# Patient Record
Sex: Female | Born: 1990 | Race: White | Hispanic: No | Marital: Married | Smoking: Never smoker
Health system: Southern US, Community
[De-identification: ages and names within clinical notes are randomized; demographics above are authoritative.]

## PROBLEM LIST (undated history)

## (undated) DIAGNOSIS — Z789 Other specified health status: Secondary | ICD-10-CM

## (undated) HISTORY — PX: NO PAST SURGERIES: SHX2092

---

## 2018-07-30 ENCOUNTER — Ambulatory Visit (HOSPITAL_COMMUNITY)
Admission: AD | Admit: 2018-07-30 | Discharge: 2018-07-30 | Disposition: A | Discharge status: Home / Self Care | Payer: Self-pay | Source: Ambulatory Visit | Attending: General Surgery | Admitting: General Surgery

## 2018-07-30 ENCOUNTER — Encounter (HOSPITAL_COMMUNITY)
Admission: AD | Disposition: A | Discharge status: Home / Self Care | Payer: Self-pay | Source: Ambulatory Visit | Attending: General Surgery

## 2018-07-30 ENCOUNTER — Emergency Department (HOSPITAL_COMMUNITY): Payer: Self-pay

## 2018-07-30 ENCOUNTER — Emergency Department (HOSPITAL_COMMUNITY)
Admission: EM | Admit: 2018-07-30 | Discharge: 2018-07-30 | Disposition: A | Discharge status: Home / Self Care | Payer: Self-pay | Attending: Emergency Medicine | Admitting: Emergency Medicine

## 2018-07-30 ENCOUNTER — Ambulatory Visit (HOSPITAL_COMMUNITY): Payer: Self-pay | Admitting: Certified Registered Nurse Anesthetist

## 2018-07-30 ENCOUNTER — Encounter (HOSPITAL_COMMUNITY): Payer: Self-pay | Admitting: Certified Registered Nurse Anesthetist

## 2018-07-30 ENCOUNTER — Encounter (HOSPITAL_COMMUNITY): Payer: Self-pay

## 2018-07-30 DIAGNOSIS — S52502A Unspecified fracture of the lower end of left radius, initial encounter for closed fracture: Secondary | ICD-10-CM | POA: Insufficient documentation

## 2018-07-30 DIAGNOSIS — S52592A Other fractures of lower end of left radius, initial encounter for closed fracture: Secondary | ICD-10-CM | POA: Insufficient documentation

## 2018-07-30 DIAGNOSIS — W1830XA Fall on same level, unspecified, initial encounter: Secondary | ICD-10-CM | POA: Insufficient documentation

## 2018-07-30 DIAGNOSIS — Y929 Unspecified place or not applicable: Secondary | ICD-10-CM | POA: Insufficient documentation

## 2018-07-30 DIAGNOSIS — Y9351 Activity, roller skating (inline) and skateboarding: Secondary | ICD-10-CM | POA: Insufficient documentation

## 2018-07-30 DIAGNOSIS — Y999 Unspecified external cause status: Secondary | ICD-10-CM | POA: Insufficient documentation

## 2018-07-30 HISTORY — DX: Other specified health status: Z78.9

## 2018-07-30 HISTORY — PX: ORIF WRIST FRACTURE: SHX2133

## 2018-07-30 LAB — POCT PREGNANCY, URINE: Preg Test, Ur: NEGATIVE

## 2018-07-30 SURGERY — OPEN REDUCTION INTERNAL FIXATION (ORIF) WRIST FRACTURE
Anesthesia: Monitor Anesthesia Care | Site: Wrist | Laterality: Left

## 2018-07-30 MED ORDER — FENTANYL CITRATE (PF) 100 MCG/2ML IJ SOLN: 25.0000 ug | INTRAMUSCULAR | Status: DC | PRN

## 2018-07-30 MED ORDER — DEXAMETHASONE SODIUM PHOSPHATE 10 MG/ML IJ SOLN
INTRAMUSCULAR | Status: DC | PRN
  Administered 2018-07-30: 10 mg via INTRAVENOUS

## 2018-07-30 MED ORDER — HYDROMORPHONE HCL 1 MG/ML IJ SOLN
1.0000 mg | Freq: Once | INTRAMUSCULAR | Status: AC
  Administered 2018-07-30: 1 mg via INTRAMUSCULAR
  Filled 2018-07-30: qty 1

## 2018-07-30 MED ORDER — ONDANSETRON HCL 4 MG/2ML IJ SOLN
INTRAMUSCULAR | Status: AC
  Filled 2018-07-30: qty 2

## 2018-07-30 MED ORDER — FENTANYL CITRATE (PF) 250 MCG/5ML IJ SOLN
INTRAMUSCULAR | Status: AC
  Filled 2018-07-30: qty 5

## 2018-07-30 MED ORDER — CEFAZOLIN SODIUM-DEXTROSE 2-4 GM/100ML-% IV SOLN
2.0000 g | Freq: Once | INTRAVENOUS | Status: AC
  Administered 2018-07-30: 2 g via INTRAVENOUS

## 2018-07-30 MED ORDER — LIDOCAINE 2% (20 MG/ML) 5 ML SYRINGE
INTRAMUSCULAR | Status: AC
  Filled 2018-07-30: qty 5

## 2018-07-30 MED ORDER — PROPOFOL 10 MG/ML IV BOLUS
INTRAVENOUS | Status: DC | PRN
  Administered 2018-07-30: 40 mg via INTRAVENOUS
  Administered 2018-07-30: 30 mg via INTRAVENOUS
  Administered 2018-07-30: 20 mg via INTRAVENOUS
  Administered 2018-07-30: 40 mg via INTRAVENOUS
  Administered 2018-07-30: 30 mg via INTRAVENOUS
  Administered 2018-07-30: 40 mg via INTRAVENOUS

## 2018-07-30 MED ORDER — FENTANYL CITRATE (PF) 100 MCG/2ML IJ SOLN: 50.0000 ug | Freq: Once | INTRAMUSCULAR | Status: DC

## 2018-07-30 MED ORDER — PROPOFOL 500 MG/50ML IV EMUL
INTRAVENOUS | Status: DC | PRN
  Administered 2018-07-30: 100 ug/kg/min via INTRAVENOUS

## 2018-07-30 MED ORDER — OXYCODONE-ACETAMINOPHEN 5-325 MG PO TABS
1.0000 | ORAL_TABLET | ORAL | Status: DC | PRN
  Administered 2018-07-30: 1 via ORAL
  Filled 2018-07-30: qty 1

## 2018-07-30 MED ORDER — LACTATED RINGERS IV SOLN
INTRAVENOUS | Status: DC | PRN
  Administered 2018-07-30 (×2): via INTRAVENOUS

## 2018-07-30 MED ORDER — ONDANSETRON HCL 4 MG/2ML IJ SOLN
INTRAMUSCULAR | Status: DC | PRN
  Administered 2018-07-30: 4 mg via INTRAVENOUS

## 2018-07-30 MED ORDER — BUPIVACAINE HCL (PF) 0.25 % IJ SOLN
INTRAMUSCULAR | Status: AC
  Filled 2018-07-30: qty 30

## 2018-07-30 MED ORDER — OXYCODONE-ACETAMINOPHEN 5-325 MG PO TABS: 1.0000 | ORAL_TABLET | Freq: Four times a day (QID) | ORAL | 0 refills | Status: DC | PRN

## 2018-07-30 MED ORDER — MIDAZOLAM HCL 2 MG/2ML IJ SOLN
INTRAMUSCULAR | Status: AC
  Filled 2018-07-30: qty 2

## 2018-07-30 MED ORDER — 0.9 % SODIUM CHLORIDE (POUR BTL) OPTIME
TOPICAL | Status: DC | PRN
  Administered 2018-07-30: 1000 mL

## 2018-07-30 MED ORDER — BUPIVACAINE-EPINEPHRINE (PF) 0.5% -1:200000 IJ SOLN
INTRAMUSCULAR | Status: DC | PRN
  Administered 2018-07-30: 30 mL via PERINEURAL

## 2018-07-30 MED ORDER — FENTANYL CITRATE (PF) 100 MCG/2ML IJ SOLN
INTRAMUSCULAR | Status: AC
  Administered 2018-07-30: 50 ug via INTRAVENOUS
  Filled 2018-07-30: qty 2

## 2018-07-30 MED ORDER — MIDAZOLAM HCL 2 MG/2ML IJ SOLN
2.0000 mg | Freq: Once | INTRAMUSCULAR | Status: AC
  Administered 2018-07-30: 2 mg via INTRAVENOUS

## 2018-07-30 MED ORDER — CEFAZOLIN SODIUM-DEXTROSE 2-4 GM/100ML-% IV SOLN
INTRAVENOUS | Status: AC
  Filled 2018-07-30: qty 100

## 2018-07-30 MED ORDER — DEXAMETHASONE SODIUM PHOSPHATE 10 MG/ML IJ SOLN
INTRAMUSCULAR | Status: AC
  Filled 2018-07-30: qty 1

## 2018-07-30 MED ORDER — DEXMEDETOMIDINE HCL IN NACL 200 MCG/50ML IV SOLN
INTRAVENOUS | Status: DC | PRN
  Administered 2018-07-30: 12 ug via INTRAVENOUS
  Administered 2018-07-30 (×2): 8 ug via INTRAVENOUS

## 2018-07-30 MED ORDER — PHENYLEPHRINE 40 MCG/ML (10ML) SYRINGE FOR IV PUSH (FOR BLOOD PRESSURE SUPPORT)
PREFILLED_SYRINGE | INTRAVENOUS | Status: DC | PRN
  Administered 2018-07-30: 80 ug via INTRAVENOUS

## 2018-07-30 MED ORDER — PHENYLEPHRINE 40 MCG/ML (10ML) SYRINGE FOR IV PUSH (FOR BLOOD PRESSURE SUPPORT)
PREFILLED_SYRINGE | INTRAVENOUS | Status: AC
  Filled 2018-07-30: qty 10

## 2018-07-30 MED ORDER — LIDOCAINE-EPINEPHRINE (PF) 1.5 %-1:200000 IJ SOLN
INTRAMUSCULAR | Status: DC | PRN
  Administered 2018-07-30: 10 mL via PERINEURAL

## 2018-07-30 SURGICAL SUPPLY — 60 items
BANDAGE ACE 3X5.8 VEL STRL LF (GAUZE/BANDAGES/DRESSINGS) ×3 IMPLANT
BIT DRILL 2 FAST STEP (BIT) ×6 IMPLANT
BIT DRILL 2.5X4 QC (BIT) ×3 IMPLANT
BNDG ESMARK 4X9 LF (GAUZE/BANDAGES/DRESSINGS) IMPLANT
CANISTER SUCTION 1500CC (MISCELLANEOUS) ×3 IMPLANT
CHLORAPREP W/TINT 26ML (MISCELLANEOUS) ×3 IMPLANT
CORD BIPOLAR FORCEPS 12FT (ELECTRODE) ×3 IMPLANT
CORDS BIPOLAR (ELECTRODE) IMPLANT
COVER SURGICAL LIGHT HANDLE (MISCELLANEOUS) ×6 IMPLANT
CUFF TOURNIQUET SINGLE 18IN (TOURNIQUET CUFF) ×3 IMPLANT
CUFF TOURNIQUET SINGLE 24IN (TOURNIQUET CUFF) IMPLANT
DRAIN TLS ROUND 10FR (DRAIN) IMPLANT
DRAPE OEC MINIVIEW 54X84 (DRAPES) ×3 IMPLANT
DRAPE SURG 17X23 STRL (DRAPES) ×3 IMPLANT
DRIVER PEG 2.0 FAST (BIT) ×3 IMPLANT
GAUZE SPONGE 4X4 12PLY STRL LF (GAUZE/BANDAGES/DRESSINGS) ×3 IMPLANT
GAUZE XEROFORM 1X8 LF (GAUZE/BANDAGES/DRESSINGS) ×3 IMPLANT
GLOVE BIOGEL M 8.0 STRL (GLOVE) ×3 IMPLANT
GLOVE BIOGEL PI IND STRL 7.0 (GLOVE) ×1 IMPLANT
GLOVE BIOGEL PI INDICATOR 7.0 (GLOVE) ×2
GLOVE SURG SS PI 7.5 STRL IVOR (GLOVE) ×12 IMPLANT
GOWN STRL REUS W/ TWL XL LVL3 (GOWN DISPOSABLE) ×1 IMPLANT
GOWN STRL REUS W/TWL XL LVL3 (GOWN DISPOSABLE) ×2
K-WIRE 1.6 (WIRE) ×4
K-WIRE FX5X1.6XNS BN SS (WIRE) ×2
KIT BASIN OR (CUSTOM PROCEDURE TRAY) ×3 IMPLANT
KWIRE FX5X1.6XNS BN SS (WIRE) ×2 IMPLANT
NEEDLE HYPO 22GX1.5 SAFETY (NEEDLE) ×3 IMPLANT
NS IRRIG 1000ML POUR BTL (IV SOLUTION) ×3 IMPLANT
PACK ORTHO EXTREMITY (CUSTOM PROCEDURE TRAY) ×3 IMPLANT
PAD CAST 3X4 CTTN HI CHSV (CAST SUPPLIES) IMPLANT
PAD CAST 4YDX4 CTTN HI CHSV (CAST SUPPLIES) IMPLANT
PADDING CAST ABS 4INX4YD NS (CAST SUPPLIES)
PADDING CAST ABS COTTON 4X4 ST (CAST SUPPLIES) IMPLANT
PADDING CAST COTTON 3X4 STRL (CAST SUPPLIES)
PADDING CAST COTTON 4X4 STRL (CAST SUPPLIES)
PEG THREADED 2.5MMX16MM LONG (Peg) ×6 IMPLANT
PEG THREADED 2.5MMX18MM LONG (Peg) ×12 IMPLANT
PLATE SHORT 21.6X48.9 NRRW LT (Plate) ×3 IMPLANT
SCREW BN 12X3.5XNS CORT TI (Screw) ×1 IMPLANT
SCREW CORT 3.5X12 (Screw) ×2 IMPLANT
SCREW CORT 3.5X14 LNG (Screw) ×6 IMPLANT
SPLINT FIBERGLASS 3X12 (CAST SUPPLIES) ×3 IMPLANT
SPLINT PLASTER CAST XFAST 4X15 (CAST SUPPLIES) IMPLANT
SPLINT PLASTER XTRA FAST SET 4 (CAST SUPPLIES)
SUT ETHILON 3 0 PS 1 (SUTURE) IMPLANT
SUT ETHILON 4 0 PS 2 18 (SUTURE) IMPLANT
SUT VIC AB 3-0 PS1 18 (SUTURE)
SUT VIC AB 3-0 PS1 18XBRD (SUTURE) IMPLANT
SUT VIC AB 3-0 SH 27 (SUTURE) ×2
SUT VIC AB 3-0 SH 27X BRD (SUTURE) ×1 IMPLANT
SUT VIC AB 4-0 PS2 18 (SUTURE) ×3 IMPLANT
SUT VICRYL 4-0 PS2 18IN ABS (SUTURE) IMPLANT
SYR BULB 3OZ (MISCELLANEOUS) ×3 IMPLANT
SYR CONTROL 10ML LL (SYRINGE) ×3 IMPLANT
TOWEL OR 17X24 6PK STRL BLUE (TOWEL DISPOSABLE) ×3 IMPLANT
TUBE CONNECTING 20'X1/4 (TUBING) ×1
TUBE CONNECTING 20X1/4 (TUBING) ×2 IMPLANT
UNDERPAD 30X30 (UNDERPADS AND DIAPERS) ×3 IMPLANT
WATER STERILE IRR 1000ML POUR (IV SOLUTION) ×3 IMPLANT

## 2018-07-30 NOTE — ED Provider Notes (Signed)
[facility] Provider Note   CSN: [id number] Arrival date & time: 07/30/18  0137     History   Chief Complaint Chief Complaint  Patient presents with  . Fall  . Wrist Pain    HPI Pamela Spence is a 27 y.o. female.  27 year old female presents to the emergency department for evaluation of left wrist pain after a fall while long boarding.  Patient fell on outstretched left hand.  Has had constant pain to her left wrist since this time.  Pain worse with movement.  Given percocet on arrival with only mild improvement to pain.  No numbness or paresthesias.  Patient is RHD.  The history is provided by the patient. No language interpreter was used.  Fall   Wrist Pain     History reviewed. No pertinent past medical history.  There are no active problems to display for this patient.   History reviewed. No pertinent surgical history.   OB History   None      Home Medications    Prior to Admission medications   Medication Sig Start Date End Date Taking? Authorizing Provider  oxyCODONE-acetaminophen (PERCOCET/ROXICET) 5-325 MG tablet Take 1-2 tablets by mouth every 6 (six) hours as needed for moderate pain or severe pain. 07/30/18   Antony MaduraHumes, Sonnia Strong, PA-C    Family History History reviewed. No pertinent family history.  Social History Social History   Tobacco Use  . Smoking status: Never Smoker  . Smokeless tobacco: Never Used  Substance Use Topics  . Alcohol use: Not on file  . Drug use: Not on file     Allergies   Patient has no known allergies.   Review of Systems Review of Systems Ten systems reviewed and are negative for acute change, except as noted in the HPI.    Physical Exam Updated Vital Signs BP 130/78 (BP Location: Right Arm)   Pulse 68   Temp 98.4 F (36.9 C)   Resp 16   Ht 5\' 4"  (1.626 m)   Wt 63.5 kg   LMP  (LMP Unknown)   SpO2 100%   BMI 24.03 kg/m   Physical Exam  Constitutional: She is  oriented to person, place, and time. She appears well-developed and well-nourished. No distress.  Nontoxic appearing and in NAD  HENT:  Head: Normocephalic and atraumatic.  Eyes: Conjunctivae and EOM are normal. No scleral icterus.  Neck: Normal range of motion.  Cardiovascular: Normal rate, regular rhythm and intact distal pulses.  Estil radial pulse 2+ in the left upper extremity.  Capillary refill brisk in all digits of the left hand.  Pulmonary/Chest: Effort normal. No stridor. No respiratory distress.  Respirations even and unlabored  Musculoskeletal:       Left wrist: She exhibits decreased range of motion, bony tenderness, swelling and deformity.  No tenting of skin to the L wrist.  Neurological: She is alert and oriented to person, place, and time. She exhibits normal muscle tone. Coordination normal.  Sensation to light touch intact in all digits of the left hand.  Able to wiggle all fingers.  Skin: Skin is warm and dry. No rash noted. She is not diaphoretic. No erythema. No pallor.  Psychiatric: She has a normal mood and affect. Her behavior is normal.  Nursing note and vitals reviewed.    ED Treatments / Results  Labs (all labs ordered are listed, but only abnormal results are displayed) Labs Reviewed - No data to display  EKG None  Radiology  Dg Wrist Complete Left  Result Date: 07/30/2018 CLINICAL DATA:  27 year old female with fall. EXAM: LEFT WRIST - COMPLETE 3+ VIEW COMPARISON:  None. FINDINGS: There is a comminuted and displaced fracture of the distal radius. There is dorsal angulation and displacement of the distal fracture fragment. No dislocation. There is mild juxta-articular osteopenia. There is soft tissue swelling of the wrist. IMPRESSION: Dorsally displaced and angulated comminuted fracture of the distal radius. Electronically Signed   By: Elgie Collard M.D.   On: 07/30/2018 02:14    Procedures Procedures (including critical care time)  Medications  Ordered in ED Medications  oxyCODONE-acetaminophen (PERCOCET/ROXICET) 5-325 MG per tablet 1 tablet (1 tablet Oral Given 07/30/18 0154)  HYDROmorphone (DILAUDID) injection 1 mg (1 mg Intramuscular Given 07/30/18 0351)    3:33 AM Case discussed with Dr. Izora Ribas on-call for hand surgery.  Is comfortable with splinting in the emergency department, but will see the patient in the office later today/tomorrow.  Requests that the patient not eat breakfast.  She will be instructed to call at 8:30 AM for formal appointment time.    Initial Impression / Assessment and Plan / ED Course  I have reviewed the triage vital signs and the nursing notes.  Pertinent labs & imaging results that were available during my care of the patient were reviewed by me and considered in my medical decision making (see chart for details).     Patient presents to the emergency department for evaluation of L wrist pain. Patient neurovascularly intact on exam. Imaging postive for comminuted, displaced fx of the distal radius of the L wrist. Compartments in the affected extremity are soft. She was placed in a sugar tong splint. Plan for close outpatient follow up with hand surgery. Rx for percocet given for pain control. Return precautions discussed and provided. Patient discharged in stable condition with no unaddressed concerns.   Final Clinical Impressions(s) / ED Diagnoses   Final diagnoses:  Closed fracture of distal end of left radius, unspecified fracture morphology, initial encounter    ED Discharge Orders         Ordered    oxyCODONE-acetaminophen (PERCOCET/ROXICET) 5-325 MG tablet  Every 6 hours PRN     07/30/18 0419           Antony Madura, PA-C 07/30/18 1610    Wilkie Aye Mayer Masker, MD 07/31/18 [phone]

## 2018-07-30 NOTE — Op Note (Signed)
NAMRuta Hinds: Spence, Pamela Spence MEDICAL RECORD [id number] ACCOUNT [id number] DATE OF BIRTH:Mar 24, 1991 FACILITY: [facility] LOCATION: [facility] PHYSICIAN:Chaundra Abreu C. Jania Steinke, MD  OPERATIVE REPORT  DATE OF PROCEDURE:  07/30/2018  PREOPERATIVE DIAGNOSIS:  Closed displaced fracture of the left distal radius.  POSTOPERATIVE DIAGNOSIS:  Closed displaced fracture of the left distal radius.  PROCEDURE:  Open reduction internal fixation of the left distal radius with a DVR anatomic volar plate.  ANESTHESIA:  Axillary block with IV sedation.    COMPLICATIONS:  None acute.  INDICATIONS:  The patient is a 27 year old female who fell on an outstretched hand earlier this morning sustaining a closed displaced fracture to her left wrist.  The fracture was unstable and such that operative fixation was felt necessary.  Risks,  benefits and alternatives of surgery were discussed with her.  She agreed to this course of action.  Consent was obtained.  DESCRIPTION OF PROCEDURE:  The patient was taken to the operating room after an axillary block was performed by anesthesia.  She was placed supine on the operating room table.  Timeout was performed.  Preoperative antibiotics were given.  The left upper  extremity was prepped and draped in normal sterile fashion.  A tourniquet was used on the upper arm.  It was exsanguinated and the tourniquet was inflated to 250 mmHg.  An incision overlying the volar wrist overlying the FCR tendon was then made.   Dissection between the FCR tendon and the radial artery was then performed down to the pronator quadratus muscle.  The pronator quadratus muscle was taken down in a hockey stick type fashion exposing the fracture site.  The fracture site was debrided of  soft tissue then manually reduced.  An appropriate sized DVR volar plate was chosen and held in place temporarily with K wires while x-rays revealed a good plate placement.  Radial shaft screw was drilled, measured and appropriate size  screw placed.  The  distal screws were each drilled, measured and appropriate sized locking screws were placed.  Next, the remaining radial shaft screws were drilled, measured and placed.  Final x-ray revealed near anatomic reduction of the fracture with good plate and  screw placement in length.  The wound was irrigated.  The deep soft tissues were closed with interrupted 3-0 Vicryl.  The skin was closed with a running 4-0 Vicryl in subcuticular fashion.  A sterile dressing and splint were applied.  The patient  tolerated the procedure well and was taken to recovery room stable.  TN/NUANCE  D:07/30/2018 T:07/30/2018 JOB:[id number]

## 2018-07-30 NOTE — Discharge Instructions (Addendum)
You were found to have a fracture to your distal radius.  You require follow-up with a hand surgeon.  Call the office of Dr. Izora Ribasoley at 8:30 AM to determine time for follow-up in the office today.  Do not eat breakfast this morning as you may require sedation in the office for additional management.  A splint has been placed in the emergency department.  Do not remove this and less told to do so by the hand surgeon.  You may apply ice over top of your splint 3-4 times per day to limit pain and swelling.  Take Percocet as needed for pain control.  Do not drive or drink alcohol after taking Percocet as it may make you drowsy and impair your judgment.  Return to the emergency department for new or concerning symptoms.

## 2018-07-30 NOTE — H&P (Signed)
Reason for Consult:Fracture L wrist  Referring Physician: ER  CC: I fell and broke my wrist  HPI:  Pamela Spence is an 27 y.o. right  handed female who presents with   Fall onto outstretched L hand; c/o pain , deformity, presented to ER - displaced L distal radius fx     .   Pain is rated at   8 /10 and is described as sharp.  Pain is constant/.  Pain is made better by rest/immobilization, worse with motion.   Associated signs/symptoms: Previous treatment:    Past Medical History:  Diagnosis Date  . Medical history non-contributory     Past Surgical History:  Procedure Laterality Date  . NO PAST SURGERIES      History reviewed. No pertinent family history.  Social History:  reports that she has never smoked. She has never used smokeless tobacco. Her alcohol and drug histories are not on file.  Allergies: No Known Allergies  Medications: I have reviewed the patient's current medications.  No results found for this or any previous visit (from the past 48 hour(s)).  Dg Wrist Complete Left  Result Date: 07/30/2018 CLINICAL DATA:  [age] year old female with fall. EXAM: LEFT WRIST - COMPLETE 3+ VIEW COMPARISON:  None. FINDINGS: There is a comminuted and displaced fracture of the distal radius. There is dorsal angulation and displacement of the distal fracture fragment. No dislocation. There is mild juxta-articular osteopenia. There is soft tissue swelling of the wrist. IMPRESSION: Dorsally displaced and angulated comminuted fracture of the distal radius. Electronically Signed   By: Pamela CollardArash  Spence M.D.   On: 07/30/2018 02:14    Pertinent items are noted in HPI. Temp:  [98.4 F (36.9 C)-98.8 F (37.1 C)] 98.8 F (37.1 C) (08/27 1222) Pulse Rate:  [68-88] 75 (08/27 1222) Resp:  [16-22] 16 (08/27 1222) BP: (111-130)/(71-78) 122/73 (08/27 1222) SpO2:  [92 %-100 %] 92 % (08/27 1222) Weight:  [63.5 kg] 63.5 kg (08/27 1222) General appearance: alert, cooperative and appears stated  age Resp: clear to auscultation bilaterally Cardio: regular rate and rhythm GI: soft, non-tender; bowel sounds normal; no masses,  no organomegaly Extremities: extremities normal, atraumatic, no cyanosis or edema Except for L wrist with obvious deformity, n/v intact, no open lacerations  Assessment: Left distal radius fracture Plan: Needs reduction, fixation I have discussed this treatment plan in detail with patient and family, including the risks of the recommended treatment or surgery, the benefits and the alternatives.  The patient understands that additional treatment may be necessary.  Pamela Spence C Pamela Spence 07/30/2018, 1:21 PM

## 2018-07-30 NOTE — Anesthesia Preprocedure Evaluation (Signed)
Anesthesia Evaluation  Patient identified by MRN, date of birth, ID band Patient awake    Reviewed: Allergy & Precautions, NPO status , Patient's Chart, lab work & pertinent test results  History of Anesthesia Complications Negative for: history of anesthetic complications  Airway Mallampati: I  TM Distance: >3 FB Neck ROM: Full    Dental  (+) Teeth Intact   Pulmonary neg pulmonary ROS,    breath sounds clear to auscultation       Cardiovascular negative cardio ROS   Rhythm:Regular     Neuro/Psych negative neurological ROS  negative psych ROS   GI/Hepatic negative GI ROS, Neg liver ROS,   Endo/Other  negative endocrine ROS  Renal/GU negative Renal ROS     Musculoskeletal Left wrist fx   Abdominal   Peds negative pediatric ROS (+)  Hematology negative hematology ROS (+)   Anesthesia Other Findings   Reproductive/Obstetrics                             Anesthesia Physical Anesthesia Plan  ASA: I  Anesthesia Plan: MAC and Regional   Post-op Pain Management:    Induction:   PONV Risk Score and Plan: 2 and Treatment may vary due to age or medical condition  Airway Management Planned: Nasal Cannula  Additional Equipment:   Intra-op Plan:   Post-operative Plan:   Informed Consent: I have reviewed the patients History and Physical, chart, labs and discussed the procedure including the risks, benefits and alternatives for the proposed anesthesia with the patient or authorized representative who has indicated his/her understanding and acceptance.   Dental advisory given  Plan Discussed with: CRNA and Surgeon  Anesthesia Plan Comments:         Anesthesia Quick Evaluation

## 2018-07-30 NOTE — Transfer of Care (Signed)
Immediate Anesthesia Transfer of Care Note  Patient: Pamela Spence  Procedure(s) Performed: OPEN REDUCTION INTERNAL FIXATION (ORIF) WRIST FRACTURE (Left Wrist)  Patient Location: PACU  Anesthesia Type:MAC combined with regional for post-op pain  Level of Consciousness: awake, alert  and oriented  Airway & Oxygen Therapy: Patient Spontanous Breathing and Patient connected to face mask oxygen  Post-op Assessment: Report given to RN and Post -op Vital signs reviewed and stable  Post vital signs: Reviewed and stable  Last Vitals:  Vitals Value Taken Time  BP    Temp    Pulse    Resp    SpO2      Last Pain:  Vitals:   07/30/18 1309  PainSc: 10-Worst pain ever      Patients Stated Pain Goal: 1 (35/57/32 2025)  Complications: No apparent anesthesia complications

## 2018-07-30 NOTE — ED Triage Notes (Signed)
Patient fell while long boarding and all patients weight was caught on left wrist.  Deformity noted to left wrist.  Pulses palpable.

## 2018-07-30 NOTE — Discharge Instructions (Signed)
Discharge Instructions:  Keep your dressing clean, dry and in place until instructed to remove by Dr. Khalani Novoa.  If the dressing becomes dirty or wet call the office for instructions during business hours. Elevate the extremity to help with swelling, this will also help with any discomfort. Take your medication as prescribed. No lifting with the injured  extremity. If you feel that the dressing is too tight, you may loosen it, but keep it on; finger tips should be pink; if there is a concern, call the office. [phone] Ice may be used if the injury is a fracture, do not apply ice directly to the skin. Please call the office on the next business day after discharge to arrange a follow up appointment.  Call [phone] between the hours of 9am - 5pm M-Th or 9am - 1pm on Fri. For most hand injuries and/or conditions, you may return to work using the uninjured hand (one handed duty) within 24-72 hours.  A detailed note will be provided to you at your follow up appointment or may contact the office prior to your follow up.    

## 2018-07-30 NOTE — Anesthesia Procedure Notes (Signed)
Anesthesia Regional Block: Axillary brachial plexus block   Pre-Anesthetic Checklist: ,, timeout performed, Correct Patient, Correct Site, Correct Laterality, Correct Procedure, Correct Position, site marked, Risks and benefits discussed,  Surgical consent,  Pre-op evaluation,  At surgeon's request and post-op pain management  Laterality: Upper and Left  Prep: chloraprep       Needles:  Injection technique: Single-shot  Needle Type: Echogenic Needle          Additional Needles:   Procedures:,,,, ultrasound used (permanent image in chart),,,,  Narrative:  Start time: 07/30/2018 1:37 PM End time: 07/30/2018 1:40 PM Injection made incrementally with aspirations every 5 mL.  Performed by: Personally   Additional Notes: H+P and labs reviewed, risks and benefits discussed with patient, procedure tolerated well without complications

## 2018-07-31 NOTE — Anesthesia Postprocedure Evaluation (Signed)
Anesthesia Post Note  Patient: Pamela Spence  Procedure(s) Performed: OPEN REDUCTION INTERNAL FIXATION (ORIF) WRIST FRACTURE (Left Wrist)     Patient location during evaluation: PACU Anesthesia Type: Regional and MAC Level of consciousness: awake and alert Pain management: pain level controlled Vital Signs Assessment: post-procedure vital signs reviewed and stable Respiratory status: spontaneous breathing, nonlabored ventilation, respiratory function stable and patient connected to nasal cannula oxygen Cardiovascular status: stable and blood pressure returned to baseline Postop Assessment: no apparent nausea or vomiting Anesthetic complications: no    Last Vitals:  Vitals:   07/30/18 1534 07/30/18 1545  BP: 105/62 107/62  Pulse: 72 71  Resp: 15 16  Temp: 36.5 C   SpO2: 99% 99%    Last Pain:  Vitals:   07/30/18 1545  PainSc: 0-No pain                 Hether Anselmo

## 2018-08-01 ENCOUNTER — Encounter (HOSPITAL_COMMUNITY): Payer: Self-pay | Admitting: General Surgery

## 2019-05-20 ENCOUNTER — Other Ambulatory Visit (HOSPITAL_COMMUNITY)
Admission: RE | Admit: 2019-05-20 | Discharge: 2019-05-20 | Disposition: A | Discharge status: Home / Self Care | Payer: Self-pay | Source: Ambulatory Visit | Attending: Obstetrics and Gynecology | Admitting: Obstetrics and Gynecology

## 2019-05-20 ENCOUNTER — Other Ambulatory Visit: Payer: Self-pay | Admitting: Obstetrics and Gynecology

## 2019-05-20 DIAGNOSIS — Z01419 Encounter for gynecological examination (general) (routine) without abnormal findings: Secondary | ICD-10-CM | POA: Insufficient documentation

## 2019-05-21 LAB — CYTOLOGY - PAP: Diagnosis: NEGATIVE

## 2019-12-05 NOTE — L&D Delivery Note (Addendum)
Delivery Note [facility] Pt of DR Richardson Dopp.    Patient Name: Pamela Spence DOB: 09-Jun-1991 MRN: [id number]  Date of admission: 08/15/2020 Delivering MD: Dale Loup  Date of delivery: 08/15/20 Type of delivery: SVD  Newborn Data: Live born female  Birth Weight:   APGAR: 8, 9  Newborn Delivery   Birth date/time: 08/15/2020 14:46:00 Delivery type: Vaginal, Spontaneous    Ruta Hinds, 29 y.o., @ [redacted]w[redacted]d,  G1P0, who was admitted for spontaneous. I was called to the room when she progressed +2 station in the second stage of labor.  Pt with with noted varibles decels that corrected after pushing. She pushed for 45/min.  She delivered a viable infant, cephalic and restituted to the ROP position over an intact perineum.  A nuchal cord   was not identified, but one loose body cord was reduced. The baby was placed on maternal abdomen while initial step of NRP were perfmored (Dry, Stimulated, and warmed). Hat placed on baby for thermoregulation. Delayed cord clamping was performed for 2 minutes.  Cord double clamped and cut.  Cord cut by father. Apgar scores were 8 and 9. Prophylactic Pitocin was started in the third stage of labor for active management. The placenta delivered spontaneously, shultz, with a 3 vessel cord and was sent to LD, placenta delivered with maternal push and rapidly. Steady trickle of blood was noted after placenta delivered.  Inspection revealed 1st degree. An examination of the vaginal vault and cervix was free from lacerations. The uterus was firm, with massage, bleeding trickle still occuring, manual exploration performed, small amount of placenta fragments were obtained and 3gm Unasyn hung for prphylatics, and TXA was hung, bleeding was then stable stable.  The repair was done under epidural.   Placenta and umbilical artery blood gas were not sent.  There were no complications during the procedure.  Mom and baby skin to skin following delivery. Left in stable  condition.  Maternal Info: Anesthesia: Epidural Episiotomy: NO Lacerations:  1st degree Suture Repair: 4.0 vicryl SH Quantativie Blood Loss (mL):   Newborn Info:  Baby Sex: female Circumcision: In pt desired with [facility] physician tomorrow.  Babies Name: Ammon APGAR (1 MIN): 8   APGAR (5 MINS): 9   APGAR (10 MINS):     Mom to postpartum.  Baby to Couplet care / Skin to Skin.  Dr Su Hilt aware.    [place], [place], NP-C 08/15/20 3:33 PM

## 2020-07-28 ENCOUNTER — Other Ambulatory Visit: Payer: Self-pay

## 2020-07-28 ENCOUNTER — Ambulatory Visit (INDEPENDENT_AMBULATORY_CARE_PROVIDER_SITE_OTHER): Payer: Self-pay | Admitting: Pediatrics

## 2020-07-28 DIAGNOSIS — Z7681 Expectant parent(s) prebirth pediatrician visit: Secondary | ICD-10-CM

## 2020-07-30 DIAGNOSIS — Z7681 Expectant parent(s) prebirth pediatrician visit: Secondary | ICD-10-CM | POA: Insufficient documentation

## 2020-07-30 NOTE — Progress Notes (Signed)
Prenatal counseling for impending newborn done-- Z76.81  

## 2020-08-15 ENCOUNTER — Encounter (HOSPITAL_COMMUNITY): Payer: Self-pay

## 2020-08-15 ENCOUNTER — Inpatient Hospital Stay (HOSPITAL_COMMUNITY)
Admission: AD | Admit: 2020-08-15 | Discharge: 2020-08-16 | DRG: 797 | Disposition: A | Discharge status: Home / Self Care | Payer: Medicaid Other | Attending: Obstetrics and Gynecology | Admitting: Obstetrics and Gynecology

## 2020-08-15 ENCOUNTER — Inpatient Hospital Stay (HOSPITAL_COMMUNITY): Payer: Medicaid Other | Admitting: Anesthesiology

## 2020-08-15 ENCOUNTER — Other Ambulatory Visit: Payer: Self-pay

## 2020-08-15 DIAGNOSIS — O99214 Obesity complicating childbirth: Secondary | ICD-10-CM | POA: Diagnosis present

## 2020-08-15 DIAGNOSIS — O4292 Full-term premature rupture of membranes, unspecified as to length of time between rupture and onset of labor: Secondary | ICD-10-CM | POA: Diagnosis present

## 2020-08-15 DIAGNOSIS — Z3A39 39 weeks gestation of pregnancy: Secondary | ICD-10-CM | POA: Diagnosis not present

## 2020-08-15 DIAGNOSIS — E669 Obesity, unspecified: Secondary | ICD-10-CM | POA: Diagnosis present

## 2020-08-15 DIAGNOSIS — O329XX0 Maternal care for malpresentation of fetus, unspecified, not applicable or unspecified: Secondary | ICD-10-CM

## 2020-08-15 DIAGNOSIS — Z20822 Contact with and (suspected) exposure to covid-19: Secondary | ICD-10-CM | POA: Diagnosis present

## 2020-08-15 DIAGNOSIS — Z3A00 Weeks of gestation of pregnancy not specified: Secondary | ICD-10-CM

## 2020-08-15 DIAGNOSIS — O4290 Premature rupture of membranes, unspecified as to length of time between rupture and onset of labor, unspecified weeks of gestation: Secondary | ICD-10-CM | POA: Diagnosis present

## 2020-08-15 LAB — SARS CORONAVIRUS 2 BY RT PCR (HOSPITAL ORDER, PERFORMED IN ~~LOC~~ HOSPITAL LAB): SARS Coronavirus 2: NEGATIVE

## 2020-08-15 LAB — CBC
HCT: 35.5 % — ABNORMAL LOW (ref 36.0–46.0)
Hemoglobin: 12.2 g/dL (ref 12.0–15.0)
MCH: 32.1 pg (ref 26.0–34.0)
MCHC: 34.4 g/dL (ref 30.0–36.0)
MCV: 93.4 fL (ref 80.0–100.0)
Platelets: 131 10*3/uL — ABNORMAL LOW (ref 150–400)
RBC: 3.8 MIL/uL — ABNORMAL LOW (ref 3.87–5.11)
RDW: 11.9 % (ref 11.5–15.5)
WBC: 8.8 10*3/uL (ref 4.0–10.5)
nRBC: 0 % (ref 0.0–0.2)

## 2020-08-15 LAB — RPR: RPR Ser Ql: NONREACTIVE

## 2020-08-15 LAB — TYPE AND SCREEN
ABO/RH(D): A POS
Antibody Screen: NEGATIVE

## 2020-08-15 LAB — POCT FERN TEST: POCT Fern Test: POSITIVE

## 2020-08-15 MED ORDER — TRANEXAMIC ACID-NACL 1000-0.7 MG/100ML-% IV SOLN: 1000.0000 mg | INTRAVENOUS | Status: DC

## 2020-08-15 MED ORDER — SODIUM CHLORIDE 0.9 % IV SOLN
3.0000 g | Freq: Once | INTRAVENOUS | Status: AC
  Administered 2020-08-15: 3 g via INTRAVENOUS
  Filled 2020-08-15: qty 8
  Filled 2020-08-15: qty 3
  Filled 2020-08-15: qty 8

## 2020-08-15 MED ORDER — OXYCODONE-ACETAMINOPHEN 5-325 MG PO TABS: 2.0000 | ORAL_TABLET | ORAL | Status: DC | PRN

## 2020-08-15 MED ORDER — EPHEDRINE 5 MG/ML INJ: 10.0000 mg | INTRAVENOUS | Status: DC | PRN

## 2020-08-15 MED ORDER — SODIUM CHLORIDE (PF) 0.9 % IJ SOLN
INTRAMUSCULAR | Status: DC | PRN
  Administered 2020-08-15: 12 mL/h via EPIDURAL

## 2020-08-15 MED ORDER — TETANUS-DIPHTH-ACELL PERTUSSIS 5-2.5-18.5 LF-MCG/0.5 IM SUSP: 0.5000 mL | Freq: Once | INTRAMUSCULAR | Status: DC

## 2020-08-15 MED ORDER — PHENYLEPHRINE 40 MCG/ML (10ML) SYRINGE FOR IV PUSH (FOR BLOOD PRESSURE SUPPORT)
80.0000 ug | PREFILLED_SYRINGE | INTRAVENOUS | Status: DC | PRN
  Filled 2020-08-15: qty 10

## 2020-08-15 MED ORDER — DIPHENHYDRAMINE HCL 25 MG PO CAPS: 25.0000 mg | ORAL_CAPSULE | Freq: Four times a day (QID) | ORAL | Status: DC | PRN

## 2020-08-15 MED ORDER — ZOLPIDEM TARTRATE 5 MG PO TABS: 5.0000 mg | ORAL_TABLET | Freq: Every evening | ORAL | Status: DC | PRN

## 2020-08-15 MED ORDER — FENTANYL-BUPIVACAINE-NACL 0.5-0.125-0.9 MG/250ML-% EP SOLN
12.0000 mL/h | EPIDURAL | Status: DC | PRN
  Filled 2020-08-15: qty 250

## 2020-08-15 MED ORDER — OXYCODONE-ACETAMINOPHEN 5-325 MG PO TABS: 1.0000 | ORAL_TABLET | ORAL | Status: DC | PRN

## 2020-08-15 MED ORDER — ONDANSETRON HCL 4 MG/2ML IJ SOLN: 4.0000 mg | Freq: Four times a day (QID) | INTRAMUSCULAR | Status: DC | PRN

## 2020-08-15 MED ORDER — ACETAMINOPHEN 325 MG PO TABS: 650.0000 mg | ORAL_TABLET | ORAL | Status: DC | PRN

## 2020-08-15 MED ORDER — OXYTOCIN-SODIUM CHLORIDE 30-0.9 UT/500ML-% IV SOLN
2.5000 [IU]/h | INTRAVENOUS | Status: DC
  Filled 2020-08-15: qty 500

## 2020-08-15 MED ORDER — ONDANSETRON HCL 4 MG PO TABS: 4.0000 mg | ORAL_TABLET | ORAL | Status: DC | PRN

## 2020-08-15 MED ORDER — WITCH HAZEL-GLYCERIN EX PADS: 1.0000 "application " | MEDICATED_PAD | CUTANEOUS | Status: DC | PRN

## 2020-08-15 MED ORDER — FENTANYL CITRATE (PF) 100 MCG/2ML IJ SOLN
50.0000 ug | INTRAMUSCULAR | Status: DC | PRN
  Administered 2020-08-15: 100 ug via INTRAVENOUS
  Filled 2020-08-15: qty 2

## 2020-08-15 MED ORDER — LACTATED RINGERS IV SOLN: INTRAVENOUS | Status: DC

## 2020-08-15 MED ORDER — LACTATED RINGERS IV SOLN: 500.0000 mL | Freq: Once | INTRAVENOUS | Status: DC

## 2020-08-15 MED ORDER — IBUPROFEN 600 MG PO TABS
600.0000 mg | ORAL_TABLET | Freq: Four times a day (QID) | ORAL | Status: DC
  Administered 2020-08-15 – 2020-08-16 (×4): 600 mg via ORAL
  Filled 2020-08-15 (×4): qty 1

## 2020-08-15 MED ORDER — PRENATAL MULTIVITAMIN CH
1.0000 | ORAL_TABLET | Freq: Every day | ORAL | Status: DC
  Administered 2020-08-16: 1 via ORAL
  Filled 2020-08-15: qty 1

## 2020-08-15 MED ORDER — DIBUCAINE (PERIANAL) 1 % EX OINT: 1.0000 "application " | TOPICAL_OINTMENT | CUTANEOUS | Status: DC | PRN

## 2020-08-15 MED ORDER — SENNOSIDES-DOCUSATE SODIUM 8.6-50 MG PO TABS
2.0000 | ORAL_TABLET | ORAL | Status: DC
  Administered 2020-08-15: 2 via ORAL
  Filled 2020-08-15: qty 2

## 2020-08-15 MED ORDER — ONDANSETRON HCL 4 MG/2ML IJ SOLN: 4.0000 mg | INTRAMUSCULAR | Status: DC | PRN

## 2020-08-15 MED ORDER — LIDOCAINE HCL (PF) 1 % IJ SOLN
INTRAMUSCULAR | Status: DC | PRN
  Administered 2020-08-15: 10 mL via EPIDURAL
  Administered 2020-08-15: 2 mL via EPIDURAL

## 2020-08-15 MED ORDER — SIMETHICONE 80 MG PO CHEW: 80.0000 mg | CHEWABLE_TABLET | ORAL | Status: DC | PRN

## 2020-08-15 MED ORDER — BENZOCAINE-MENTHOL 20-0.5 % EX AERO
1.0000 "application " | INHALATION_SPRAY | CUTANEOUS | Status: DC | PRN
  Filled 2020-08-15: qty 56

## 2020-08-15 MED ORDER — LACTATED RINGERS IV SOLN: 500.0000 mL | INTRAVENOUS | Status: DC | PRN

## 2020-08-15 MED ORDER — TRANEXAMIC ACID-NACL 1000-0.7 MG/100ML-% IV SOLN
INTRAVENOUS | Status: AC
  Administered 2020-08-15: 1000 mg
  Filled 2020-08-15: qty 100

## 2020-08-15 MED ORDER — DIPHENHYDRAMINE HCL 50 MG/ML IJ SOLN: 12.5000 mg | INTRAMUSCULAR | Status: DC | PRN

## 2020-08-15 MED ORDER — COCONUT OIL OIL: 1.0000 "application " | TOPICAL_OIL | Status: DC | PRN

## 2020-08-15 MED ORDER — LIDOCAINE HCL (PF) 1 % IJ SOLN: 30.0000 mL | INTRAMUSCULAR | Status: DC | PRN

## 2020-08-15 MED ORDER — PHENYLEPHRINE 40 MCG/ML (10ML) SYRINGE FOR IV PUSH (FOR BLOOD PRESSURE SUPPORT): 80.0000 ug | PREFILLED_SYRINGE | INTRAVENOUS | Status: DC | PRN

## 2020-08-15 MED ORDER — OXYTOCIN BOLUS FROM INFUSION
333.0000 mL | Freq: Once | INTRAVENOUS | Status: AC
  Administered 2020-08-15: 333 mL via INTRAVENOUS

## 2020-08-15 MED ORDER — SOD CITRATE-CITRIC ACID 500-334 MG/5ML PO SOLN: 30.0000 mL | ORAL | Status: DC | PRN

## 2020-08-15 NOTE — Anesthesia Procedure Notes (Signed)
Epidural Patient location during procedure: OB Start time: 08/15/2020 7:29 AM End time: 08/15/2020 7:38 AM  Staffing Anesthesiologist: Lannie Fields, DO Performed: anesthesiologist   Preanesthetic Checklist Completed: patient identified, IV checked, risks and benefits discussed, monitors and equipment checked, pre-op evaluation and timeout performed  Epidural Patient position: sitting Prep: DuraPrep and site prepped and draped Patient monitoring: continuous pulse ox, blood pressure, heart rate and cardiac monitor Approach: midline Location: L3-L4 Injection technique: LOR air  Needle:  Needle type: Tuohy  Needle gauge: 17 G Needle length: 9 cm Needle insertion depth: 4.5 cm Catheter type: closed end flexible Catheter size: 19 Gauge Catheter at skin depth: 9 cm Test dose: negative  Assessment Sensory level: T8 Events: blood not aspirated, injection not painful, no injection resistance, no paresthesia and negative IV test  Additional Notes Patient identified. Risks/Benefits/Options discussed with patient including but not limited to bleeding, infection, nerve damage, paralysis, failed block, incomplete pain control, headache, blood pressure changes, nausea, vomiting, reactions to medication both or allergic, itching and postpartum back pain. Confirmed with bedside nurse the patient's most recent platelet count. Confirmed with patient that they are not currently taking any anticoagulation, have any bleeding history or any family history of bleeding disorders. Patient expressed understanding and wished to proceed. All questions were answered. Sterile technique was used throughout the entire procedure. Please see nursing notes for vital signs. Test dose was given through epidural catheter and negative prior to continuing to dose epidural or start infusion. Warning signs of high block given to the patient including shortness of breath, tingling/numbness in hands, complete motor  block, or any concerning symptoms with instructions to call for help. Patient was given instructions on fall risk and not to get out of bed. All questions and concerns addressed with instructions to call with any issues or inadequate analgesia.  Reason for block:procedure for pain

## 2020-08-15 NOTE — Progress Notes (Signed)
Labor Progress Note  Anivea Velasques is a 29 y.o. female, G1P0000, IUP at 39.1 weeks [facility] pt of DR Bing Ree, presenting for PROM at 0200, clear, no cxt prior, after PROM pt started having irregular cxt. GBS-. Low risk Males, with desires for in pt circ. Uncomplicated pregnancy. Pt endorse + Fm.  Denies vaginal bleeding.   Subjective: Pt stable with supportive husband in bed, pt still comfortable and starting to feel rectal pressure. No cervical change noted, discussed R/B/A of IUPC and pitocin. Pt verbalized consent for IUPC and desires to wait on pitocin no matter if it is indicated or not.  Patient Active Problem List   Diagnosis Date Noted  . PROM (premature rupture of membranes) 08/15/2020  . Counseling of expectant parents at pediatric pre-birth visit 07/30/2020   Objective: BP 117/72   Pulse 78   Temp 98 F (36.7 C) (Oral)   Resp 16   Ht 5\' 4"  (1.626 m)   Wt 89.3 kg   SpO2 100%   BMI 33.80 kg/m  No intake/output data recorded. Total I/O In: -  Out: 675 [Urine:675] NST: FHR baseline 145 bpm, Variability: moderate, Accelerations:present, Decelerations: occ variable when turning= Cat 1/Reactive CTX:  regular, every 2-3 minutes, lasting 60 second-80 seconds Uterus gravid, soft non tender, moderate to palpate with contractions.  SVE:  Dilation: 8 Effacement (%): 90 Station: Plus 1 Exam by:: [facility]   IUPC placed with ease, pt and fetus tolerated well.  MVU 170s  Assessment:  Ryli Standlee is a 29 y.o. female, G1P0000, IUP at 39.1 weeks [facility] pt of DR 37, presenting for PROM at 0200, clear, no cxt prior, after PROM pt started having irregular cxt. GBS-. Low risk Males, with desires for in pt circ. Uncomplicated pregnancy. Pt endorse + Fm.  Denies vaginal bleeding. Progressing in active labor naturally. Comfortable post epidural. Had decel around morning time, resolved with position change, in active labor. Placed IUPC and discussed pitocin if indicated.   Patient Active Problem List   Diagnosis Date Noted  . PROM (premature rupture of membranes) 08/15/2020  . Counseling of expectant parents at pediatric pre-birth visit 07/30/2020   NICHD: Category 1  Membranes:  PROM @ 0200 on 9/12, clear x 10hrs, no s/s of infection  IUPC placed: MVU 170s  Pain management:               IV pain management: x1 dose at 0708             Epidural placement: Placed @ 0745 on 9/12  GBS Negative  Plan: Continue labor plan Continuous monitoring Rest Frequent position changes to facilitate fetal rotation and descent. Will reassess with cervical exam at 1600 or earlier if necessary Anticipate starting pitocin with pt consent if no cervical change and MVU not 200-250s.  Anticipate labor progression and vaginal delivery.   11/12, NP-C, CNM, MSN 08/15/2020. 12:29 PM

## 2020-08-15 NOTE — Anesthesia Postprocedure Evaluation (Signed)
Anesthesia Post Note  Patient: Pamela Spence  Procedure(s) Performed: AN AD HOC LABOR EPIDURAL     Patient location during evaluation: Mother Baby Anesthesia Type: Epidural Level of consciousness: awake and alert Pain management: satisfactory to patient Vital Signs Assessment: post-procedure vital signs reviewed and stable Respiratory status: spontaneous breathing Cardiovascular status: blood pressure returned to baseline Postop Assessment: no headache, no backache, epidural receding, adequate PO intake, no apparent nausea or vomiting and patient able to bend at knees Anesthetic complications: no   No complications documented.  Last Vitals:  Vitals:   08/15/20 1645 08/15/20 1729  BP: 120/80 128/85  Pulse: 85 92  Resp: 18 18  Temp: 37 C 37.7 C  SpO2: 99% 100%    Last Pain:  Vitals:   08/15/20 1729  TempSrc: Oral  PainSc: 0-No pain   Pain Goal: Patients Stated Pain Goal: 0 (08/15/20 0428)                 Marquette Saa

## 2020-08-15 NOTE — MAU Note (Addendum)
.  Pamela Spence is a 29 y.o. at [redacted]w[redacted]d here in MAU reporting: SROM clear fluid at 2am and ctx every 4-6 minutes. +FM. No vaginal bleeding.  Pain score: 4/10   Lab orders placed from triage: MAU labor evaluation standing order set.

## 2020-08-15 NOTE — OB Triage Provider Note (Signed)
[id number] Pamela Spence 01/02/1991  Patient informed that the ultrasound is considered a limited OB ultrasound and is not intended to be a complete ultrasound exam.  Patient also informed that the ultrasound is not being completed with the intent of assessing for fetal or placental anomalies or any pelvic abnormalities.  Explained that the purpose of today's ultrasound is to assess for  presentation.  Patient acknowledges the purpose of the exam and the limitations of the study.  Cephalic presentation noted.   Cherre Robins, CNM 08/15/2020, 5:26 AM

## 2020-08-15 NOTE — Progress Notes (Addendum)
Labor Progress Note  Pamela Spence is a 29 y.o. female, G1P0000, IUP at 39.1 weeks [facility] pt of DR Bing Ree, presenting for PROM at 0200, clear, no cxt prior, after PROM pt started having irregular cxt. GBS-. Low risk Males, with desires for in pt circ. Uncomplicated pregnancy. Pt endorse + Fm.  Denies vaginal bleeding.   Subjective: Pt stable with supportive husband in bed, pt was feeling strong cxt, and comfortable post epidural now. Pt denies any concerns or questions.  Patient Active Problem List   Diagnosis Date Noted   PROM (premature rupture of membranes) 08/15/2020   Counseling of expectant parents at pediatric pre-birth visit 07/30/2020   Objective: BP 115/64    Pulse 81    Temp (!) 97.5 F (36.4 C) (Oral)    Resp 18    Ht 5\' 4"  (1.626 m)    Wt 89.3 kg    SpO2 100%    BMI 33.80 kg/m  No intake/output data recorded. No intake/output data recorded. NST: FHR baseline 150 bpm, Variability: moderate, Accelerations:present, Decelerations: x1 variable decel due to being on back after epidural, and repetitive late decls noted x3 with periods of minimal varability= Cat 2/Reactive to scalp stim CTX:  regular, every 2-3 minutes, lasting 60 second-80 seconds Uterus gravid, soft non tender, moderate to palpate with contractions.  SVE:  Dilation: 6 Effacement (%): 90 Station: 0 Exam by:: [facility] CNM  Assessment:  Pamela Spence is a 29 y.o. female, G1P0000, IUP at 39.1 weeks [facility] pt of DR 37, presenting for PROM at 0200, clear, no cxt prior, after PROM pt started having irregular cxt. GBS-. Low risk Males, with desires for in pt circ. Uncomplicated pregnancy. Pt endorse + Fm.  Denies vaginal bleeding. Progressing in active labor naturally. Comfortable post epidural. Had x1 decel due to laying on back after epidural and foley cath placement.  Patient Active Problem List   Diagnosis Date Noted   PROM (premature rupture of membranes) 08/15/2020   Counseling of  expectant parents at pediatric pre-birth visit 07/30/2020   NICHD: Category 2  Due to being on back after epidural placement, variable decel noted with x3 late decels noted, position change  with fluid bolus given. Periods of minimal variability noted, most likely infant sleep cycle.   Membranes:  PROM @ 0200 on 9/12, clear x 6hrs, no s/s of infection  Pain management:               IV pain management: xPRN             Epidural placement: Placed @ 0745 on 9/12  GBS Negative  Plan: Continue labor plan Continuous monitoring Rest Frequent position changes to facilitate fetal rotation and descent. Will reassess with cervical exam at 1200 or earlier if necessary Anticipate labor progression and vaginal delivery.  Predicted birth time 12:21pm  DR 11/12 notified.   Su Hilt, NP-C, CNM, MSN 08/15/2020. 8:15 AM

## 2020-08-15 NOTE — H&P (Signed)
Pamela Spence is a 29 y.o. female, G1P0000, IUP at 39.1 weeks [facility] pt of DR Bing Ree, presenting for PROM at 0200, clear, no cxt prior, after PROM pt started having irregular cxt. GBS-. Low risk Males, with desires for in pt circ. Uncomplicated pregnancy. Pt endorse + Fm.  Denies vaginal bleeding.   Patient Active Problem List   Diagnosis Date Noted  . Counseling of expectant parents at pediatric pre-birth visit 07/30/2020    Medications Prior to Admission  Medication Sig Dispense Refill Last Dose  . calcium carbonate (TUMS - DOSED IN MG ELEMENTAL CALCIUM) 500 MG chewable tablet Chew 1 tablet by mouth daily.   08/14/2020 at Unknown time  . Prenatal Vit-Fe Fumarate-FA (MULTIVITAMIN-PRENATAL) 27-0.8 MG TABS tablet Take 1 tablet by mouth daily at 12 noon.   08/14/2020 at Unknown time  . oxyCODONE-acetaminophen (PERCOCET/ROXICET) 5-325 MG tablet Take 1-2 tablets by mouth every 6 (six) hours as needed for moderate pain or severe pain. 15 tablet 0     Past Medical History:  Diagnosis Date  . Medical history non-contributory      No current facility-administered medications on file prior to encounter.   Current Outpatient Medications on File Prior to Encounter  Medication Sig Dispense Refill  . calcium carbonate (TUMS - DOSED IN MG ELEMENTAL CALCIUM) 500 MG chewable tablet Chew 1 tablet by mouth daily.    . Prenatal Vit-Fe Fumarate-FA (MULTIVITAMIN-PRENATAL) 27-0.8 MG TABS tablet Take 1 tablet by mouth daily at 12 noon.    Marland Kitchen oxyCODONE-acetaminophen (PERCOCET/ROXICET) 5-325 MG tablet Take 1-2 tablets by mouth every 6 (six) hours as needed for moderate pain or severe pain. 15 tablet 0     No Known Allergies  OB History    Gravida  1   Para      Term      Preterm      AB      Living        SAB      TAB      Ectopic      Multiple      Live Births             Past Medical History:  Diagnosis Date  . Medical history non-contributory    Past Surgical History:   Procedure Laterality Date  . NO PAST SURGERIES    . ORIF WRIST FRACTURE Left 07/30/2018   Procedure: OPEN REDUCTION INTERNAL FIXATION (ORIF) WRIST FRACTURE;  Surgeon: Knute Neu, MD;  Location: [facility] OR;  Service: Plastics;  Laterality: Left;   Family History: family history is not on file. Social History:  reports that she has never smoked. She has never used smokeless tobacco. She reports previous alcohol use. She reports that she does not use drugs.   Prenatal Transfer Tool  Maternal Diabetes: No Genetic Screening: Normal Maternal Ultrasounds/Referrals: Normal Fetal Ultrasounds or other Referrals:  None Maternal Substance Abuse:  No Significant Maternal Medications:  None Significant Maternal Lab Results: Group B Strep negative  ROS:  Review of Systems  Constitutional: Negative.   HENT: Negative.   Eyes: Negative.   Respiratory: Negative.   Cardiovascular: Negative.   Gastrointestinal: Positive for abdominal pain.  Genitourinary:       Vaginal leakage of clear fluids   Musculoskeletal: Negative.   Skin: Negative.   Neurological: Negative.   Endo/Heme/Allergies: Negative.   Psychiatric/Behavioral: Negative.      Physical Exam: BP 136/84 (BP Location: Right Arm)   Pulse 80   Temp 98.4 F (36.9 C) (  Oral)   Resp 16   Ht 5\' 4"  (1.626 m)   Wt 89.3 kg   BMI 33.80 kg/m   Physical Exam Vitals and nursing note reviewed. Exam conducted with a chaperone present.  Constitutional:      Appearance: Normal appearance. She is normal weight.  HENT:     Head: Normocephalic and atraumatic.     Nose: Nose normal.     Mouth/Throat:     Mouth: Mucous membranes are moist.  Eyes:     Extraocular Movements: Extraocular movements intact.     Pupils: Pupils are equal, round, and reactive to light.  Cardiovascular:     Rate and Rhythm: Normal rate and regular rhythm.     Pulses: Normal pulses.     Heart sounds: Normal heart sounds.  Pulmonary:     Effort: Pulmonary effort is  normal.     Breath sounds: Normal breath sounds.  Abdominal:     General: Bowel sounds are normal.  Genitourinary:    Comments: Pelvis adequate, uterus gravida equal to dates.  Musculoskeletal:        General: Normal range of motion.     Cervical back: Normal range of motion and neck supple.  Skin:    General: Skin is warm and dry.     Capillary Refill: Capillary refill takes less than 2 seconds.  Neurological:     General: No focal deficit present.     Mental Status: She is alert and oriented to person, place, and time.  Psychiatric:        Mood and Affect: Mood normal.        Behavior: Behavior normal.      NST: FHR baseline 130 bpm, Variability: moderate, Accelerations:present, Decelerations:  Absent= Cat 1/Reactive UC:   irregular, every 2-5 minutes SVE:   Dilation: 4.5 Effacement (%): 80, 90 Exam by:: [email or web address], RN, vertex verified by fetal sutures.  Leopold's: Position vertex, EFW 7.5lbs via leopold's.   Labs: Results for orders placed or performed during the hospital encounter of 08/15/20 (from the past 24 hour(s))  POCT fern test     Status: Abnormal   Collection Time: 08/15/20  4:40 AM  Result Value Ref Range   POCT Fern Test Positive = ruptured amniotic membanes     Imaging:  No results found.  MAU Course: Orders Placed This Encounter  Procedures  . SARS Coronavirus 2 by RT PCR (hospital order, performed in [facility] hospital lab) Nasopharyngeal Nasopharyngeal Swab  . Contraction - monitoring  . External fetal heart monitoring  . Vaginal exam  . Cervical Exam  . POCT fern test   No orders of the defined types were placed in this encounter.   Assessment/Plan: Pamela Spence is a 29 y.o. female, G1P0000, IUP at 39.1 weeks [facility] pt of DR 37, presenting for PROM at 0200, clear, no cxt prior, after PROM pt started having irregular cxt. GBS-. Low risk Males, with desires for in pt circ. Uncomplicated pregnancy. Pt endorse + Fm.  Denies  vaginal bleeding.   FWB: Cat 1 Fetal Tracing.   Plan: Admit to Birthing Suite per consult with DR Bing Ree Routine [facility] orders Pain med/epidural prn Expectant management. Anticipate labor progression   Sallye Ober NP-C, CNM, MSN 08/15/2020, 5:44 AM

## 2020-08-15 NOTE — Lactation Note (Signed)
This note was copied from a baby's chart. Lactation Consultation Note  Patient Name: Pamela Spence [id number] Date: 08/15/2020 Reason for consult: Initial assessment;Term;1st time breastfeeding;Primapara  Visited with mom of a 2 hours old FT female, she's a P1 and reported moderate breast changes during the pregnancy. LC studen Angelica present during this visit. LC showed mom how to hand express, noticed that she has dense breast tissue but it's still compressible. Her nipples are slightly short shafted but still evert upon stimulation. Colostrum noted when doing hand expression, LC showed parents how to finger feed, baby was on mother's chest doing STS.  Offered assistance with latch and mom politely declined, she told LC that baby just fed. Asked mom to call for assistance when needed. Reviewed normal newborn behavior, feeding cues, cluster feeding and size of baby's stomach.   Feeding plan:  1. Encouraged mom to feed baby STS 8-12 times/24 hours or sooner if feeding cues are present 2. Hand expression and finger/spoon feeding were also encouraged  BF brochure, BF resources and feeding diary were reviewed. FOB and GOB were support people during [facility] consultation. Family reported all questions and concerns were answered, they're both aware of LC OP services and will call PRN.   Maternal Data Formula Feeding for Exclusion: Yes Reason for exclusion: Mother's choice to formula and breast feed on admission Has patient been taught Hand Expression?: Yes Does the patient have breastfeeding experience prior to this delivery?: No  Feeding Feeding Type: Breast Fed  LATCH Score Latch: Repeated attempts needed to sustain latch, nipple held in mouth throughout feeding, stimulation needed to elicit sucking reflex.  Audible Swallowing: A few with stimulation  Type of Nipple: Everted at rest and after stimulation  Comfort (Breast/Nipple): Soft / non-tender  Hold (Positioning): No assistance needed  to correctly position infant at breast.  LATCH Score: 8  Interventions Interventions: Breast feeding basics reviewed;Breast massage;Hand express;Breast compression  Lactation Tools Discussed/Used WIC Program: No   Consult Status Consult Status: Follow-up Date: 08/16/20 Follow-up type: In-patient    Pamela Spence 08/15/2020, 5:38 PM

## 2020-08-15 NOTE — Anesthesia Preprocedure Evaluation (Signed)
Anesthesia Evaluation  Patient identified by MRN, date of birth, ID band Patient awake    Reviewed: Allergy & Precautions, Patient's Chart, lab work & pertinent test results  Airway Mallampati: II  TM Distance: >3 FB Neck ROM: Full    Dental no notable dental hx.    Pulmonary neg pulmonary ROS,    Pulmonary exam normal breath sounds clear to auscultation       Cardiovascular negative cardio ROS Normal cardiovascular exam Rhythm:Regular Rate:Normal     Neuro/Psych negative neurological ROS  negative psych ROS   GI/Hepatic negative GI ROS, Neg liver ROS,   Endo/Other  Obesity BMI 34  Renal/GU negative Renal ROS  negative genitourinary   Musculoskeletal negative musculoskeletal ROS (+)   Abdominal   Peds negative pediatric ROS (+)  Hematology negative hematology ROS (+) hct 25.5, plt 131   Anesthesia Other Findings   Reproductive/Obstetrics (+) Pregnancy                             Anesthesia Physical Anesthesia Plan  ASA: II and emergent  Anesthesia Plan: Epidural   Post-op Pain Management:    Induction:   PONV Risk Score and Plan: 2  Airway Management Planned: Natural Airway  Additional Equipment: None  Intra-op Plan:   Post-operative Plan:   Informed Consent: I have reviewed the patients History and Physical, chart, labs and discussed the procedure including the risks, benefits and alternatives for the proposed anesthesia with the patient or authorized representative who has indicated his/her understanding and acceptance.       Plan Discussed with:   Anesthesia Plan Comments:         Anesthesia Quick Evaluation

## 2020-08-16 LAB — CBC
HCT: 29.8 % — ABNORMAL LOW (ref 36.0–46.0)
Hemoglobin: 10.3 g/dL — ABNORMAL LOW (ref 12.0–15.0)
MCH: 32.9 pg (ref 26.0–34.0)
MCHC: 34.6 g/dL (ref 30.0–36.0)
MCV: 95.2 fL (ref 80.0–100.0)
Platelets: 122 10*3/uL — ABNORMAL LOW (ref 150–400)
RBC: 3.13 MIL/uL — ABNORMAL LOW (ref 3.87–5.11)
RDW: 12.2 % (ref 11.5–15.5)
WBC: 11 10*3/uL — ABNORMAL HIGH (ref 4.0–10.5)
nRBC: 0 % (ref 0.0–0.2)

## 2020-08-16 MED ORDER — IBUPROFEN 600 MG PO TABS
600.0000 mg | ORAL_TABLET | Freq: Four times a day (QID) | ORAL | 1 refills | Status: DC | PRN
Start: 2020-08-16 — End: 2021-08-04

## 2020-08-16 NOTE — Discharge Summary (Addendum)
Postpartum Discharge Summary  Date of Service updated 08/16/2020     Patient Name: Pamela Spence DOB: 07/17/1991 MRN: [id number]  Date of admission: 08/15/2020 Delivery date:08/15/2020  Delivering provider: MONTANA, JADE  Date of discharge: 08/16/2020  Admitting diagnosis: PROM (premature rupture of membranes) [O42.90] Intrauterine pregnancy: [redacted]w[redacted]d     Secondary diagnosis:  Active Problems:   PROM (premature rupture of membranes)  Additional problems: Retained placenta     Discharge diagnosis: Term Pregnancy Delivered                                              Post partum procedures:manual removal of placenta/ TXA 1 gram  Augmentation: N/A Complications: None  Hospital course: Onset of Labor With Vaginal Delivery      29 y.o. yo G1P1001 at [redacted]w[redacted]d was admitted in Active Labor on 08/15/2020. Patient had an uncomplicated labor course as follows:  Membrane Rupture Time/Date: 2:00 AM ,08/15/2020   Delivery Method:Vaginal, Spontaneous  Episiotomy: None  Lacerations:  1st degree  Patient had an uncomplicated postpartum course.  She is ambulating, tolerating a regular diet, passing flatus, and urinating well. Patient is discharged home in stable condition on 08/16/20.  Newborn Data: Birth date:08/15/2020  Birth time:2:46 PM  Gender:Female  Living status:Living  Apgars:8 ,9  Weight:3605 g   Magnesium Sulfate received: No BMZ received: No Rhophylac:No MMR:N/A T-DaP:Given prenatally Flu: N/A Transfusion:No  Physical exam  Vitals:   08/16/20 0115 08/16/20 0545 08/16/20 0746 08/16/20 1333  BP: 129/86 133/90 119/83 127/63  Pulse: 75 79 61 66  Resp: 16 16  17  Temp: 98.8 F (37.1 C) 97.6 F (36.4 C) 98.5 F (36.9 C) 98.2 F (36.8 C)  TempSrc: Oral Oral  Oral  SpO2: 99% 99%  99%  Weight:      Height:       General: alert, cooperative and no distress Lochia: appropriate Uterine Fundus: firm Incision: N/A DVT Evaluation: No evidence of DVT seen on physical  exam. Labs: Lab Results  Component Value Date   WBC 11.0 (H) 08/16/2020   HGB 10.3 (L) 08/16/2020   HCT 29.8 (L) 08/16/2020   MCV 95.2 08/16/2020   PLT 122 (L) 08/16/2020   No flowsheet data found. Edinburgh Score: No flowsheet data found.    After visit meds:     Discharge home in stable condition Infant Feeding: Bottle and Breast Infant Disposition:home with mother Discharge instruction: per After Visit Summary and Postpartum booklet. Activity: Advance as tolerated. Pelvic rest for 6 weeks.  Diet: routine diet Anticipated Birth Control: Unsure Postpartum Appointment:6 weeks Additional Postpartum F/U: NA Future Appointments:No future appointments. Follow up Visit:  Follow-up Information    Cole, Tara, MD. Schedule an appointment as soon as possible for a visit in 6 week(s).   Specialty: Obstetrics and Gynecology Why: please call to schedule a postpartum visit with Dr. Cole in 6 weeks  Contact information: [address] [place] [address] [phone]                   08/16/2020 Tara Cole, MD  

## 2020-08-16 NOTE — Progress Notes (Addendum)
   08/16/20 0750  PIH   PIH (WDL) X  Headache present No  Visual Disturbance None  Epigastric pain Sharp  Atypical edema present? No  Reflexes Plus 2  Clonus Absent  Dr Richardson Dopp notified. Md does not want to draw additional labs at this time & stated she will check in with mom around lunchtime.

## 2020-08-16 NOTE — Lactation Note (Signed)
This note was copied from a baby's chart. Lactation Consultation Note  Patient Name: Boy Erna Brossard IONGE'X Date: 08/16/2020 Reason for consult: Follow-up assessment;Mother's request   Moms  request to see lactation.  Parents concerned because he is sleepy and not feeding well.  Explained this can be normal.  Asked mom if she new how to hand express and spoon feed.  Mom reported no.  Demo how to spoon feed him small drops of colostrum.  This really roused him and he was ready to latch.  Minimal assist with latching him on moms left breast in cross cradle hold. Infant latched and breastfed well 20 plus minutes.  Reviewed ways to wake a sleepy baby and hand expression/spoon feeding.  Urged parents to feed on cue but if he doesn't cue to offer breast every few hours. Urged hand expression to help if he doesn't latch.  Left mom and baby breastfeeding.  Urged to feed on cue and at least 8-12 or more times day.  Urged to call lactation as needed.       Kentaro Alewine S Shiya Fogelman 08/16/2020, 7:06 PM

## 2020-08-16 NOTE — Progress Notes (Signed)
Post Partum Day 1 Subjective: no complaints, up ad lib, voiding, tolerating PO and pt desires early discharge home   Pt reports pain under right breast  For last 3-4 weeks  Objective: Blood pressure 127/63, pulse 66, temperature 98.2 F (36.8 C), temperature source Oral, resp. rate 17, height 5\' 4"  (1.626 m), weight 89.3 kg, SpO2 99 %, unknown if currently breastfeeding.  Physical Exam:  General: alert, cooperative and no distress Lochia: appropriate Uterine Fundus: firm Incision: NA DVT Evaluation: No evidence of DVT seen on physical exam. MS. Pain under breast over ribs. Reproduced upon palpation .   Recent Labs    08/15/20 0511 08/16/20 0517  HGB 12.2 10.3*  HCT 35.5* 29.8*    Assessment/Plan: Discharge home and Breastfeeding  Chostochondritis- pt reassured recommend ibuprofen prn   patient is planning an outpatient circumcision  Follow up in the office in 6 weeks.    LOS: 1 day   08/18/20 08/16/2020, 1:49 PM

## 2020-08-16 NOTE — Lactation Note (Signed)
This note was copied from a baby's chart. Lactation Consultation Note  Patient Name: Pamela Spence [id number] Date: 08/16/2020 Reason for consult: Follow-up assessment  P1 mother whose infant is now 17 hours old.  This is a term baby at 39+1 weeks.  Baby was swaddled and asleep in mother's arms when I arrived.  Mother had no immediate questions/concerns related to breast feeding.  She has been able to hand express a few colostrum drops and feed them back to baby.  Encouraged her to continue practicing breast massage and hand expression.  Mother is familiar with finger feeding.  Mother will feed 8-12 times/24 hours or sooner if baby shows feeding cues.  She will call for latch assistance as needed.  Discussed cluster feeding tonight and recommended that mother rest when baby is asleep.  Father present and supportive. Mother has a DEBP for home use.   Maternal Data    Feeding Feeding Type: Breast Fed  LATCH Score Latch: Repeated attempts needed to sustain latch, nipple held in mouth throughout feeding, stimulation needed to elicit sucking reflex.  Audible Swallowing: None  Type of Nipple: Everted at rest and after stimulation  Comfort (Breast/Nipple): Soft / non-tender  Hold (Positioning): Assistance needed to correctly position infant at breast and maintain latch.  LATCH Score: 6  Interventions    Lactation Tools Discussed/Used     Consult Status Consult Status: Follow-up Date: 08/17/20 Follow-up type: In-patient    Todd Jelinski R Annastyn Silvey 08/16/2020, 2:05 PM

## 2020-08-16 NOTE — Progress Notes (Signed)
   08/16/20 0750  Hourly Rounding  Assessment Alert  Intervention Call light w/in reach;Pain assessed  Pain Assessment  Pain Assessment 0-10  Pain Score 5  Pain Location Abdomen  Pain Orientation Right;Upper;Medial;Lateral  Pain Descriptors / Indicators Sharp;Sore  Pain Frequency Constant  Pain Onset On-going  pt states this pain started at 37 weeks. Pain is the same now as when she was pregnant. States she can tell when it flares and gets worse. Pt will discuss w provider when they round this am

## 2021-08-04 ENCOUNTER — Other Ambulatory Visit: Payer: Self-pay

## 2021-08-04 ENCOUNTER — Inpatient Hospital Stay (HOSPITAL_COMMUNITY)
Admission: AD | Admit: 2021-08-04 | Discharge: 2021-08-04 | Disposition: A | Discharge status: Home / Self Care | Payer: Medicaid Other | Attending: Obstetrics & Gynecology | Admitting: Obstetrics & Gynecology

## 2021-08-04 ENCOUNTER — Ambulatory Visit: Payer: Self-pay | Admitting: *Deleted

## 2021-08-04 ENCOUNTER — Inpatient Hospital Stay (HOSPITAL_COMMUNITY): Payer: Medicaid Other

## 2021-08-04 ENCOUNTER — Encounter (HOSPITAL_COMMUNITY): Payer: Self-pay | Admitting: Obstetrics & Gynecology

## 2021-08-04 DIAGNOSIS — Z3A01 Less than 8 weeks gestation of pregnancy: Secondary | ICD-10-CM | POA: Insufficient documentation

## 2021-08-04 DIAGNOSIS — O3680X0 Pregnancy with inconclusive fetal viability, not applicable or unspecified: Secondary | ICD-10-CM

## 2021-08-04 DIAGNOSIS — O26851 Spotting complicating pregnancy, first trimester: Secondary | ICD-10-CM | POA: Insufficient documentation

## 2021-08-04 DIAGNOSIS — O26891 Other specified pregnancy related conditions, first trimester: Secondary | ICD-10-CM | POA: Diagnosis present

## 2021-08-04 DIAGNOSIS — R109 Unspecified abdominal pain: Secondary | ICD-10-CM | POA: Diagnosis not present

## 2021-08-04 DIAGNOSIS — Z79899 Other long term (current) drug therapy: Secondary | ICD-10-CM | POA: Diagnosis not present

## 2021-08-04 DIAGNOSIS — O3411 Maternal care for benign tumor of corpus uteri, first trimester: Secondary | ICD-10-CM | POA: Insufficient documentation

## 2021-08-04 DIAGNOSIS — O3481 Maternal care for other abnormalities of pelvic organs, first trimester: Secondary | ICD-10-CM | POA: Insufficient documentation

## 2021-08-04 LAB — URINALYSIS, ROUTINE W REFLEX MICROSCOPIC
Bacteria, UA: NONE SEEN
Bilirubin Urine: NEGATIVE
Glucose, UA: NEGATIVE mg/dL
Ketones, ur: NEGATIVE mg/dL
Leukocytes,Ua: NEGATIVE
Nitrite: NEGATIVE
Protein, ur: NEGATIVE mg/dL
Specific Gravity, Urine: 1.014 (ref 1.005–1.030)
pH: 6 (ref 5.0–8.0)

## 2021-08-04 LAB — HCG, QUANTITATIVE, PREGNANCY: hCG, Beta Chain, Quant, S: 582 m[IU]/mL — ABNORMAL HIGH (ref <5)

## 2021-08-04 LAB — CBC
HCT: 39.9 % (ref 36.0–46.0)
Hemoglobin: 13.9 g/dL (ref 12.0–15.0)
MCH: 31.9 pg (ref 26.0–34.0)
MCHC: 34.8 g/dL (ref 30.0–36.0)
MCV: 91.5 fL (ref 80.0–100.0)
Platelets: 217 10*3/uL (ref 150–400)
RBC: 4.36 MIL/uL (ref 3.87–5.11)
RDW: 12 % (ref 11.5–15.5)
WBC: 6.4 10*3/uL (ref 4.0–10.5)
nRBC: 0 % (ref 0.0–0.2)

## 2021-08-04 LAB — WET PREP, GENITAL
Clue Cells Wet Prep HPF POC: NONE SEEN
Sperm: NONE SEEN
Trich, Wet Prep: NONE SEEN
WBC, Wet Prep HPF POC: NONE SEEN
Yeast Wet Prep HPF POC: NONE SEEN

## 2021-08-04 LAB — POCT PREGNANCY, URINE: Preg Test, Ur: POSITIVE — AB

## 2021-08-04 NOTE — Telephone Encounter (Signed)
C/o abdominal pain and spotting since yesterday evening. Estimated pregnant [redacted] weeks and 5 days. "Drop or 2 of blood on pad" with tiny blood clot bright red blood in "discharge". Patient unable to identify tissue in discharge. Abdominal cramping mild and reports like menstrual cramping. Denies chest pain , difficulty breathing, more vaginal bleeding noted. Care advise given. Patient verbalized understanding of care advise and to call back or go to ED / LD ED if symptoms worsen.

## 2021-08-04 NOTE — Telephone Encounter (Signed)
Reason for Disposition  MILD vaginal bleeding (e.g., < 1 pad / hour) or SPOTTING  [1] Intermittent lower abdominal pain (e.g., cramping) AND [2] present > 24 hours  Answer Assessment - Initial Assessment Questions 1. LOCATION: "Where does it hurt?"      *No Answer* 2. RADIATION: "Does the pain shoot anywhere else?" (e.g., chest, back, shoulder)     *No Answer* 3. ONSET: "When did the pain begin?" (e.g., minutes, hours or days ago)      *No Answer* 4. ONSET: "Gradual or sudden onset?"     *No Answer* 5. PATTERN "Does the pain come and go, or has it been constant since it started?"      *No Answer* 6. SEVERITY: "How bad is the pain?" "What does it keep you from doing?"  (e.g., Scale 1-10; mild, moderate, or severe)   - MILD (1-3): doesn't interfere with normal activities, abdomen soft and not tender to touch    - MODERATE (4-7): interferes with normal activities or awakens from sleep, abdomen tender to touch    - SEVERE (8-10): excruciating pain, doubled over, unable to do any normal activities     *No Answer* 7. RECURRENT SYMPTOM: "Have you ever had this type of stomach pain before?" If Yes, ask: "When was the last time?" and "What happened that time?"      *No Answer* 8. CAUSE: "What do you think is causing the stomach pain?"     *No Answer* 9. RELIEVING/AGGRAVATING FACTORS: "What makes it better or worse?" (e.g., antacids, bowel movement, movement)     *No Answer* 10. OTHER SYMPTOMS:"Do you have any other symptoms?" (e.g., back pain, diarrhea, fever, urination pain, vaginal discharge, vomiting)       *No Answer* 11. EDD: "What date are you expecting to deliver?"       *No Answer*  Answer Assessment - Initial Assessment Questions 1. ONSET: "When did this bleeding start?"       Noted yesterday  2. DESCRIPTION: "Describe the bleeding that you are having." "How much bleeding is there?"    - SPOTTING: spotting, or pinkish / brownish mucous discharge; does not fill panty liner or pad     - MILD:  less than 1 pad / hour; less than patient's usual menstrual bleeding   - MODERATE: 1-2 pads / hour; 1 menstrual cup every 6 hours; small-medium blood clots (e.g., pea, grape, small coin)   - SEVERE: soaking 2 or more pads/hour for 2 or more hours; 1 menstrual cup every 2 hours; bleeding not contained by pads or continuous red blood from vagina; large blood clots (e.g., golf ball, large coin)      Spotting  3. ABDOMINAL PAIN SEVERITY: If present, ask: "How bad is it?"  (e.g., Scale 1-10; mild, moderate, or severe)   - MILD (1-3): doesn't interfere with normal activities, abdomen soft and not tender to touch    - MODERATE (4-7): interferes with normal activities or awakens from sleep, abdomen tender to touch    - SEVERE (8-10): excruciating pain, doubled over, unable to do any normal activities     Mild  4. PREGNANCY: "Do you know how many weeks or months pregnant you are?" "When was the first day of your last normal menstrual period?"     Estimated 6 weeks and 5 days  5. HEMODYNAMIC STATUS: "Are you weak or feeling lightheaded?" If Yes, ask: "Can you stand and walk normally?"      No  6. OTHER SYMPTOMS: "What other symptoms are  you having with the bleeding?" (e.g., passed tissue, vaginal discharge, fever, menstrual-type cramps)     Menstual type cramps, noted tiny blood clot bright red in discharge. "Drop or 2 on pad"  Protocols used: Pregnancy - Abdominal Pain Less Than [redacted] Weeks EGA-A-AH, Pregnancy - Vaginal Bleeding Less Than [redacted] Weeks EGA-A-AH

## 2021-08-04 NOTE — MAU Note (Signed)
Pt instructed in obtaining vag swabs which she did without difficulty. THen back to lobby

## 2021-08-04 NOTE — MAU Note (Signed)
Had positive upt last wk and was positive. Had some spotting last night and this morning started cramping which has continued. Cramping worse since this afternoon. Spotting continues and is light. Had one tiny clot tonight so wanted to see if everything is ok. Lmp 06/16/21

## 2021-08-04 NOTE — MAU Provider Note (Signed)
History     CSN: [id number]  Arrival date and time: 08/04/21 1918   Event Date/Time   First Provider Initiated Contact with Patient 08/04/21 2156      Chief Complaint  Patient presents with   Abdominal Pain   Vaginal Bleeding   HPI Pamela Spence is a 30 y.o. G2P1001 at [redacted]w[redacted]d by LMP who presents to MAU with chief complaints of vaginal spotting and abdominal cramping in the setting of a positive home pregnancy test. These are new problems, onset 08/03/2021. Patient's spotting is light.  She has not needed to don a pad and has not experienced any associated acute symptoms like dizziness, weakness or syncope. She is remote from sexual intercourse.   Patient's abdominal pain is crampy, suprapubic and does not radiate. Pain score is 3-4/10. She denies aggravating or alleviating factors. She denies dysuria, fever or recent illness.   OB History     Gravida  2   Para  1   Term  1   Preterm      AB      Living  1      SAB      IAB      Ectopic      Multiple  0   Live Births  1           Past Medical History:  Diagnosis Date   Medical history non-contributory     Past Surgical History:  Procedure Laterality Date   NO PAST SURGERIES     ORIF WRIST FRACTURE Left 07/30/2018   Procedure: OPEN REDUCTION INTERNAL FIXATION (ORIF) WRIST FRACTURE;  Surgeon: Knute Neu, MD;  Location: [facility] OR;  Service: Plastics;  Laterality: Left;    No family history on file.  Social History   Tobacco Use   Smoking status: Never   Smokeless tobacco: Never  Substance Use Topics   Alcohol use: Not Currently   Drug use: Never    Allergies: No Known Allergies  Medications Prior to Admission  Medication Sig Dispense Refill Last Dose   calcium carbonate (TUMS - DOSED IN MG ELEMENTAL CALCIUM) 500 MG chewable tablet Chew 1 tablet by mouth daily.      ibuprofen (ADVIL) 600 MG tablet Take 1 tablet (600 mg total) by mouth every 6 (six) hours as needed. 30 tablet 1    Prenatal  Vit-Fe Fumarate-FA (MULTIVITAMIN-PRENATAL) 27-0.8 MG TABS tablet Take 1 tablet by mouth daily at 12 noon.       Review of Systems  Gastrointestinal:  Positive for abdominal pain.  Genitourinary:  Positive for vaginal bleeding.  All other systems reviewed and are negative. Physical Exam   Blood pressure (!) 144/80, pulse 76, temperature 98.4 F (36.9 C), resp. rate 17, height 5\' 4"  (1.626 m), weight 71.7 kg, last menstrual period 06/16/2021, unknown if currently breastfeeding.  Physical Exam Vitals and nursing note reviewed. Exam conducted with a chaperone present.  Constitutional:      General: She is not in acute distress.    Appearance: She is well-developed. She is not ill-appearing.  Cardiovascular:     Rate and Rhythm: Normal rate.  Pulmonary:     Effort: Pulmonary effort is normal.     Breath sounds: Normal breath sounds.  Abdominal:     Palpations: Abdomen is soft.     Tenderness: There is no abdominal tenderness.  Skin:    Capillary Refill: Capillary refill takes less than 2 seconds.  Neurological:     Mental Status: She is alert and  oriented to person, place, and time.  Psychiatric:        Mood and Affect: Mood normal.        Behavior: Behavior normal.    MAU Course  Procedures  Orders Placed This Encounter  Procedures   Wet prep, genital   US OB LESS THAN 14 WEEKS WITH OB TRANSVAGINAL   Urinalysis, Routine w reflex microscopic Urine, Clean Catch   CBC   hCG, quantitative, pregnancy   Nursing communication   Pregnancy, urine POC   Discharge patient   Patient Vitals for the past 24 hrs:  BP Temp Temp src Pulse Resp SpO2 Height Weight  08/04/21 2213 123/81 98.4 F (36.9 C) Oral 63 18 100 % -- --  08/04/21 1942 (!) 144/80 98.4 F (36.9 C) -- 76 17 -- 5\' 4"  (1.626 m) 71.7 kg   Results for orders placed or performed during the hospital encounter of 08/04/21 (from the past 24 hour(s))  Wet prep, genital     Status: None   Collection Time: 08/04/21  7:50 PM   Result Value Ref Range   Yeast Wet Prep HPF POC NONE SEEN NONE SEEN   Trich, Wet Prep NONE SEEN NONE SEEN   Clue Cells Wet Prep HPF POC NONE SEEN NONE SEEN   WBC, Wet Prep HPF POC NONE SEEN NONE SEEN   Sperm NONE SEEN   Urinalysis, Routine w reflex microscopic Urine, Clean Catch     Status: Abnormal   Collection Time: 08/04/21  7:56 PM  Result Value Ref Range   Color, Urine YELLOW YELLOW   APPearance CLEAR CLEAR   Specific Gravity, Urine 1.014 1.005 - 1.030   pH 6.0 5.0 - 8.0   Glucose, UA NEGATIVE NEGATIVE mg/dL   Hgb urine dipstick LARGE (A) NEGATIVE   Bilirubin Urine NEGATIVE NEGATIVE   Ketones, ur NEGATIVE NEGATIVE mg/dL   Protein, ur NEGATIVE NEGATIVE mg/dL   Nitrite NEGATIVE NEGATIVE   Leukocytes,Ua NEGATIVE NEGATIVE   RBC / HPF 0-5 0 - 5 RBC/hpf   WBC, UA 0-5 0 - 5 WBC/hpf   Bacteria, UA NONE SEEN NONE SEEN   Squamous Epithelial / LPF 6-10 0 - 5   Mucus PRESENT   Pregnancy, urine POC     Status: Abnormal   Collection Time: 08/04/21  7:57 PM  Result Value Ref Range   Preg Test, Ur POSITIVE (A) NEGATIVE  CBC     Status: None   Collection Time: 08/04/21  8:00 PM  Result Value Ref Range   WBC 6.4 4.0 - 10.5 K/uL   RBC 4.36 3.87 - 5.11 MIL/uL   Hemoglobin 13.9 12.0 - 15.0 g/dL   HCT 10/04/21 69.6 - 29.5 %   MCV 91.5 80.0 - 100.0 fL   MCH 31.9 26.0 - 34.0 pg   MCHC 34.8 30.0 - 36.0 g/dL   RDW 28.4 13.2 - 44.0 %   Platelets 217 150 - 400 K/uL   nRBC 0.0 0.0 - 0.2 %  hCG, quantitative, pregnancy     Status: Abnormal   Collection Time: 08/04/21  8:00 PM  Result Value Ref Range   hCG, Beta Chain, Quant, S 582 (H) <5 mIU/mL   10/04/21 OB LESS THAN 14 WEEKS WITH OB TRANSVAGINAL  Result Date: 08/04/2021 CLINICAL DATA:  Spotting.  Positive beta HCG of 582 EXAM: OBSTETRIC <14 WK 10/04/2021 AND TRANSVAGINAL OB US TECHNIQUE: Both transabdominal and transvaginal ultrasound examinations were performed for complete evaluation of the gestation as well as the maternal uterus, adnexal  regions, and  pelvic cul-de-sac. Transvaginal technique was performed to assess early pregnancy. COMPARISON:  None. FINDINGS: Intrauterine gestational sac: None Yolk sac:  Not Visualized. Embryo:  Not Visualized. Cardiac Activity: Not Visualized. Maternal uterus/adnexae: Anteverted uterus. Endometrial thickness of 0.9 cm. Subserosal anterior uterine body fibroid measuring up to 2.5 cm. Right ovary measures 3.9 x 3.2 x 2.8 cm. Right ovarian corpus luteal cyst. Left ovary measures 3.2 x 2.0 x 2.4 cm. No adnexal masses identified. No free fluid within the pelvis. IMPRESSION: No evidence of an intrauterine pregnancy at this time. No adnexal masses identified. In the setting of a positive pregnancy test, an ectopic pregnancy cannot be entirely excluded. Recommend close clinical follow-up, short interval repeat exam, and serial beta HCG levels. Electronically Signed   By: Duanne Guess D.O.   On: 08/04/2021 21:52     Assessment and Plan  --30 y.o. G2P1001 with + GS --Pregnancy of unknown location --Quant hCG 582 --Blood type A POS --Discharge home in stable condition with ectopic/bleeding precautions  F/U: --Return to MAU in 48 hours for stat Quant hCG  Calvert Cantor, CNM 08/05/2021, 12:31 AM

## 2021-08-05 LAB — GC/CHLAMYDIA PROBE AMP (~~LOC~~) NOT AT ARMC
Chlamydia: NEGATIVE
Comment: NEGATIVE
Comment: NORMAL
Neisseria Gonorrhea: NEGATIVE

## 2021-08-06 ENCOUNTER — Inpatient Hospital Stay (HOSPITAL_COMMUNITY)
Admission: AD | Admit: 2021-08-06 | Discharge: 2021-08-06 | Disposition: A | Discharge status: Home / Self Care | Payer: Medicaid Other | Source: Ambulatory Visit | Attending: Obstetrics & Gynecology | Admitting: Obstetrics & Gynecology

## 2021-08-06 ENCOUNTER — Other Ambulatory Visit: Payer: Self-pay

## 2021-08-06 DIAGNOSIS — O039 Complete or unspecified spontaneous abortion without complication: Secondary | ICD-10-CM | POA: Diagnosis present

## 2021-08-06 DIAGNOSIS — O3481 Maternal care for other abnormalities of pelvic organs, first trimester: Secondary | ICD-10-CM | POA: Diagnosis not present

## 2021-08-06 DIAGNOSIS — O3411 Maternal care for benign tumor of corpus uteri, first trimester: Secondary | ICD-10-CM | POA: Insufficient documentation

## 2021-08-06 DIAGNOSIS — Z3A01 Less than 8 weeks gestation of pregnancy: Secondary | ICD-10-CM | POA: Insufficient documentation

## 2021-08-06 LAB — HCG, QUANTITATIVE, PREGNANCY: hCG, Beta Chain, Quant, S: 243 m[IU]/mL — ABNORMAL HIGH (ref <5)

## 2021-08-06 NOTE — MAU Note (Signed)
Here for HCG repeat labs. Reports bleeding has slowed to a light period. Mild pain she reports also.

## 2021-08-06 NOTE — MAU Provider Note (Addendum)
History   None      Chief Complaint:  Labs Only   Pamela Spence is  30 y.o. G2P1001 Patient's last menstrual period was 06/16/2021.Marland Kitchen Patient is here for follow up of quantitative HCG and ongoing surveillance of pregnancy status.   She is [redacted]w[redacted]d weeks gestation  by LMP.    Since her last visit, the patient is with a new complaint:  yesterday her bleeding was the heaviest it has been, passing some quarter sized clots and cramping a lot.  Today, the patient reports bleeding as  lighter than period, slowed down since yesterday and cramps are milder.   General ROS:  cramping has also decreased, ROS negative  Her previous Quantitative HCG values are:  Lab Results  Component Value Date   HCGBETAQNT 582 (H) 08/04/2021    Physical Exam   Blood pressure 119/70, pulse 67, temperature 98.2 F (36.8 C), temperature source Oral, resp. rate 16, height 5\' 4"  (1.626 m), weight 71.7 kg, last menstrual period 06/16/2021, SpO2 100 %, unknown if currently breastfeeding.  Focused Gynecological Exam: examination not indicated  Labs: No results found for this or any previous visit (from the past 24 hour(s)).  Ultrasound Studies:   06/18/2021 OB LESS THAN 14 WEEKS WITH OB TRANSVAGINAL  Result Date: 08/04/2021 CLINICAL DATA:  Spotting.  Positive beta HCG of 582 EXAM: OBSTETRIC <14 WK 10/04/2021 AND TRANSVAGINAL OB US TECHNIQUE: Both transabdominal and transvaginal ultrasound examinations were performed for complete evaluation of the gestation as well as the maternal uterus, adnexal regions, and pelvic cul-de-sac. Transvaginal technique was performed to assess early pregnancy. COMPARISON:  None. FINDINGS: Intrauterine gestational sac: None Yolk sac:  Not Visualized. Embryo:  Not Visualized. Cardiac Activity: Not Visualized. Maternal uterus/adnexae: Anteverted uterus. Endometrial thickness of 0.9 cm. Subserosal anterior uterine body fibroid measuring up to 2.5 cm. Right ovary measures 3.9 x 3.2 x 2.8 cm. Right ovarian corpus  luteal cyst. Left ovary measures 3.2 x 2.0 x 2.4 cm. No adnexal masses identified. No free fluid within the pelvis. IMPRESSION: No evidence of an intrauterine pregnancy at this time. No adnexal masses identified. In the setting of a positive pregnancy test, an ectopic pregnancy cannot be entirely excluded. Recommend close clinical follow-up, short interval repeat exam, and serial beta HCG levels. Electronically Signed   By: [place] D.O.   On: 08/04/2021 21:52    Assessment: [redacted]w[redacted]d weeks gestation   Plan:   [redacted]w[redacted]d 08/06/2021, 8:37 PM   Lab results: Results for orders placed or performed during the hospital encounter of 08/06/21 (from the past 24 hour(s))  hCG, quantitative, pregnancy     Status: Abnormal   Collection Time: 08/06/21  8:46 PM  Result Value Ref Range   hCG, Beta Chain, Quant, S 243 (H) <5 mIU/mL    MDM: Hcg dropped from 582 2 days ago to 243 today.  She reports mild bleeding without cramping today but had heavier bleeding and cramping yesterday.  This is consistent with a spontaneous miscarriage. Discussed results with pt and need for follow up. She lives in [place] and is interested in getting care now and in the future at the [facility] office.  Message sent for miscarriage follow up. Precautions/reasons to return to MAU reviewed.  A/P: 1. SAB (spontaneous abortion)     D/C home with bleeding/miscarriage precautions  [facility], CNM 11:00 PM

## 2021-08-12 ENCOUNTER — Other Ambulatory Visit: Payer: Medicaid Other

## 2021-08-12 ENCOUNTER — Other Ambulatory Visit: Payer: Self-pay

## 2021-08-12 DIAGNOSIS — O039 Complete or unspecified spontaneous abortion without complication: Secondary | ICD-10-CM

## 2021-08-13 LAB — BETA HCG QUANT (REF LAB): hCG Quant: 63 m[IU]/mL

## 2021-08-16 ENCOUNTER — Telehealth: Payer: Self-pay | Admitting: *Deleted

## 2021-08-16 NOTE — Telephone Encounter (Signed)
-----   Message from Tereso Newcomer, MD sent at 08/15/2021 10:03 AM EDT ----- Continue weekly HCG draws until negative

## 2021-08-16 NOTE — Telephone Encounter (Signed)
Pt informed of HCG and appt made for another repeat on 9/16

## 2021-08-19 ENCOUNTER — Other Ambulatory Visit: Payer: Medicaid Other

## 2021-08-19 ENCOUNTER — Other Ambulatory Visit: Payer: Self-pay

## 2021-08-19 DIAGNOSIS — O039 Complete or unspecified spontaneous abortion without complication: Secondary | ICD-10-CM

## 2021-08-20 LAB — BETA HCG QUANT (REF LAB): hCG Quant: 20 m[IU]/mL

## 2021-08-23 ENCOUNTER — Other Ambulatory Visit: Payer: Self-pay

## 2021-08-23 ENCOUNTER — Encounter: Payer: Self-pay | Admitting: Obstetrics and Gynecology

## 2021-08-23 ENCOUNTER — Ambulatory Visit (INDEPENDENT_AMBULATORY_CARE_PROVIDER_SITE_OTHER): Payer: Medicaid Other | Admitting: Obstetrics and Gynecology

## 2021-08-23 VITALS — BP 101/65 | HR 57 | Ht 64.0 in | Wt 157.8 lb

## 2021-08-23 DIAGNOSIS — F3281 Premenstrual dysphoric disorder: Secondary | ICD-10-CM

## 2021-08-23 DIAGNOSIS — O039 Complete or unspecified spontaneous abortion without complication: Secondary | ICD-10-CM | POA: Diagnosis not present

## 2021-08-23 DIAGNOSIS — Z5189 Encounter for other specified aftercare: Secondary | ICD-10-CM

## 2021-08-23 NOTE — Progress Notes (Signed)
Obstetrics and Gynecology Visit Return Patient Evaluation  Appointment Date: 08/23/2021  Primary Care Provider: Darrow Bussing  OBGYN Clinic: [facility]  Chief Complaint: f/u SAB  History of Present Illness:  Pamela Spence is a 30 y.o. [phone] with above CC. PMHx negative.  Pt seen in the MAU on 9/1 for ain and bleeding and had a quant of 582 and negative u/s. 9/3 rpt quant 243, 9/9 63, 9/16 20.  Pt states she just finished a period and currently asymptomatic.  A POS.  Pap negative 2020  Review of Systems: as noted in the History of Present Illness.  Medications: None   Allergies: has No Known Allergies.  Physical Exam:  BP 101/65   Pulse (!) 57   Ht 5\' 4"  (1.626 m)   Wt 157 lb 12.8 oz (71.6 kg)   Breastfeeding No   BMI 27.09 kg/m  Body mass index is 27.09 kg/m. General appearance: Well nourished, well developed female in no acute distress.  Neuro/Psych:  Normal mood and affect.    Assessment: pt stable  Plan:  Follow up SAB Condolences given. Pt declines BC currently. Pt to continue on folic acid.    2. PMDD (premenstrual dysphoric disorder) Previously on fluxoetine 20 qday for this but didn't need it last period. Pt to follow up any future s/s   RTC: PRN  MD Attending [facility])

## 2021-08-25 ENCOUNTER — Encounter: Payer: Self-pay | Admitting: Obstetrics and Gynecology

## 2021-10-13 IMAGING — US US OB < 14 WEEKS - US OB TV
1 series · 15 of 28 positions shown · non-contrast
Comparison: None.

CLINICAL DATA: Spotting.  Positive beta HCG of 582

EXAM:
OBSTETRIC <14 WK US AND TRANSVAGINAL OB US
TECHNIQUE: Both transabdominal and transvaginal ultrasound examinations were
performed for complete evaluation of the gestation as well as the
maternal uterus, adnexal regions, and pelvic cul-de-sac.
Transvaginal technique was performed to assess early pregnancy.

[Series 1: us ob < 14 weeks - us ob tv · 15 of 94 slices shown]
[im 1/94]
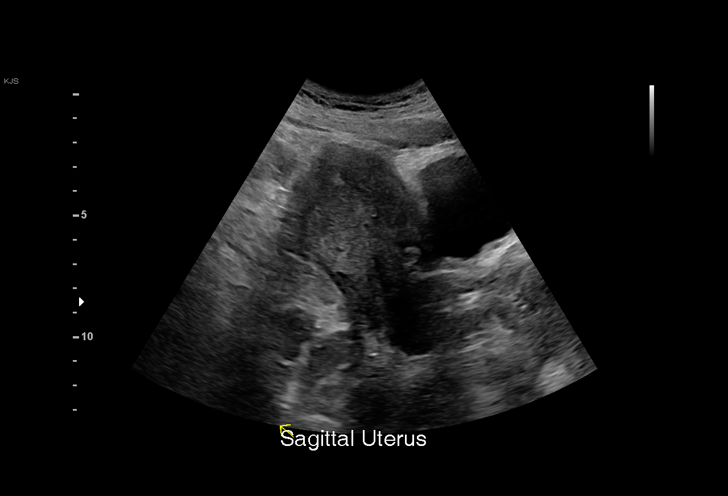
[im 7/94]
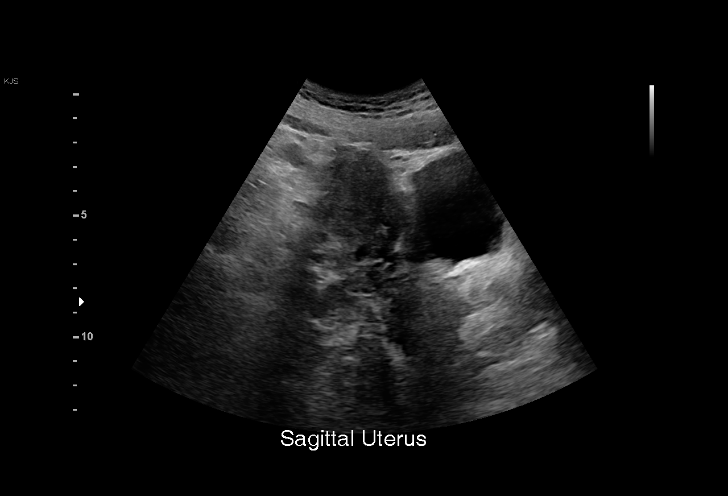
[im 14/94]
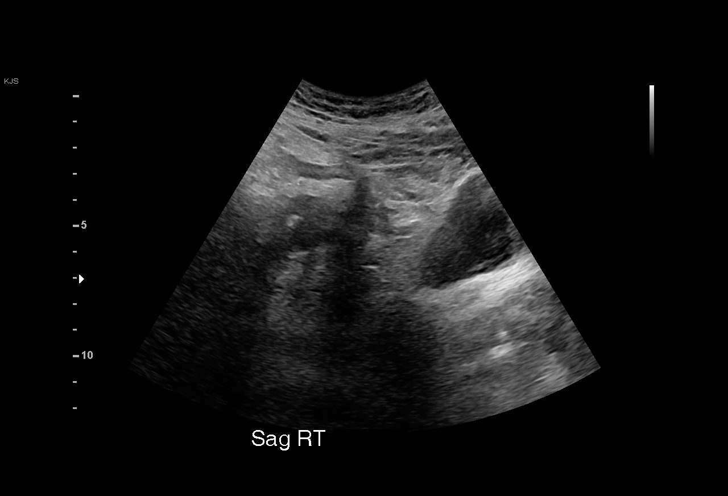
[im 21/94]
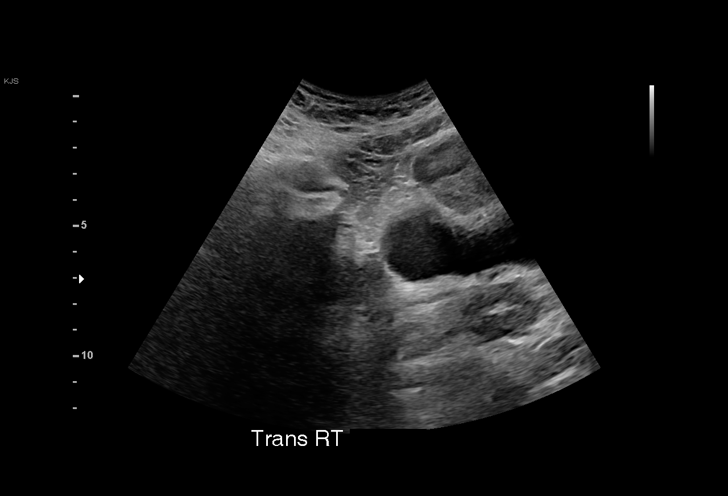
[im 28/94]
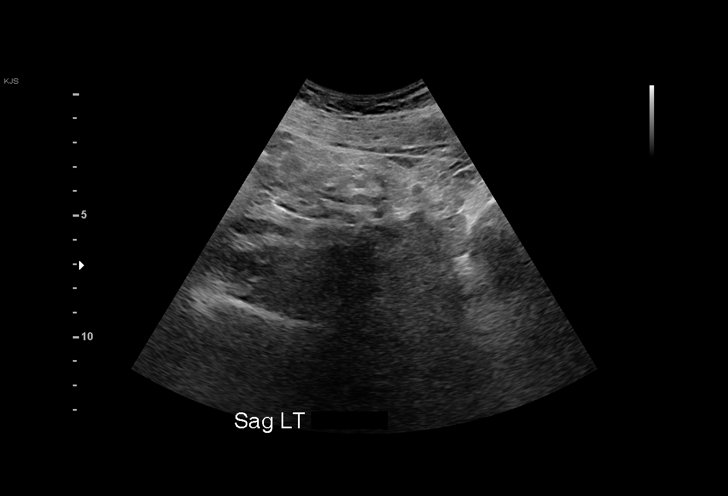
[im 35/94]
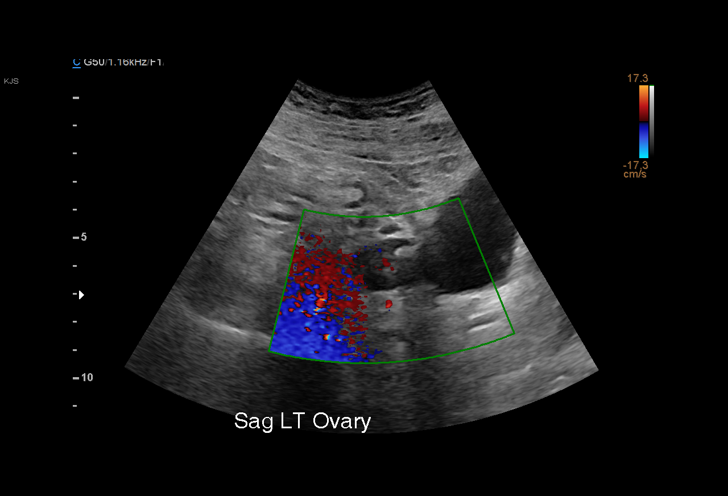
[im 42/94]
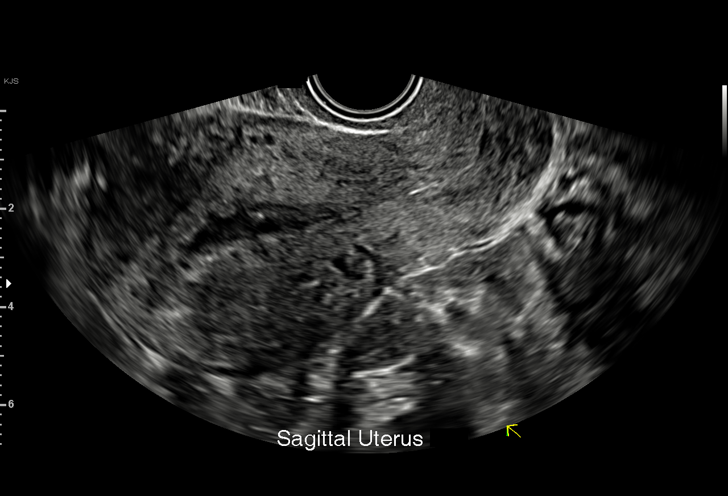
[im 49/94]
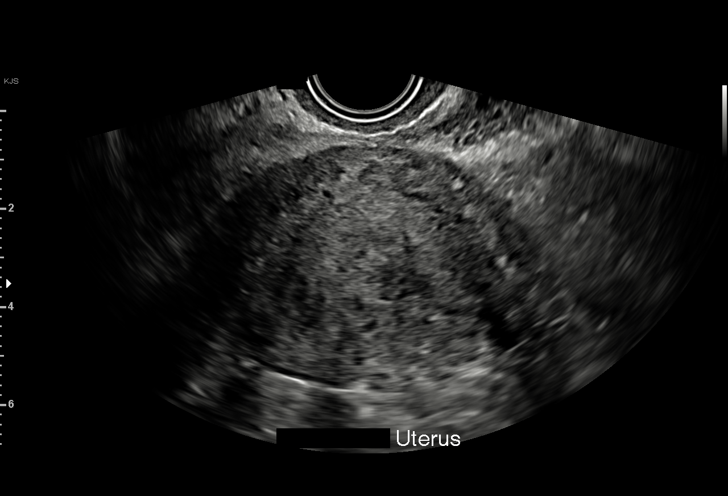
[im 52/94]
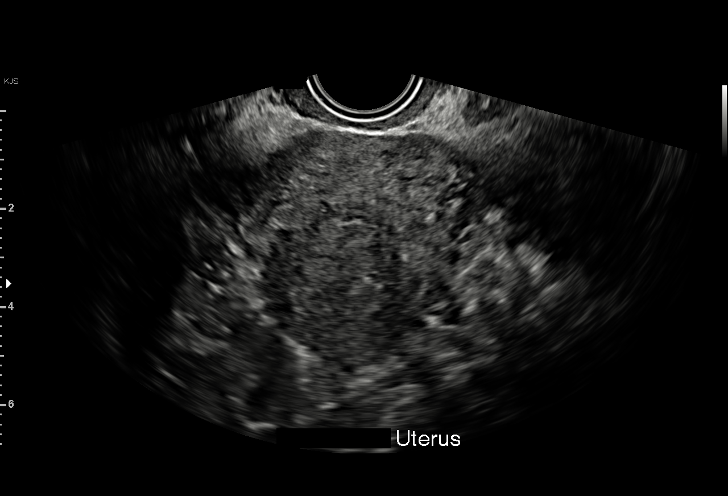
[im 59/94]
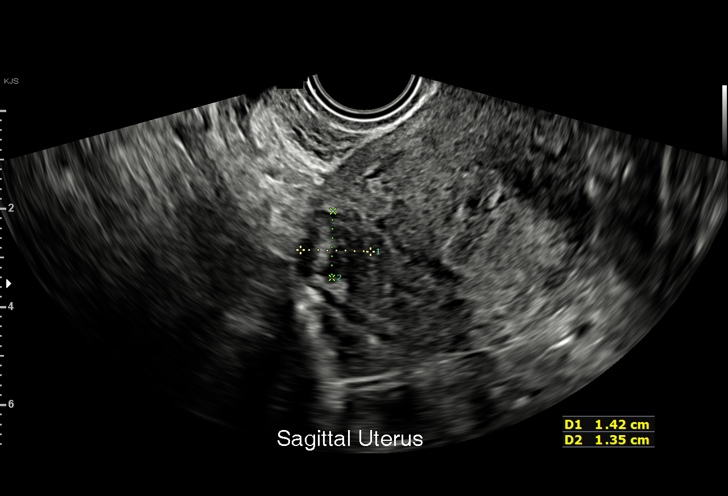
[im 66/94]
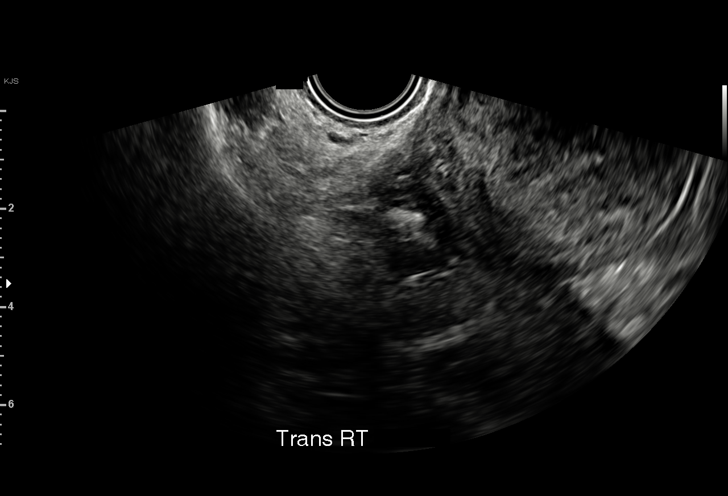
[im 73/94]
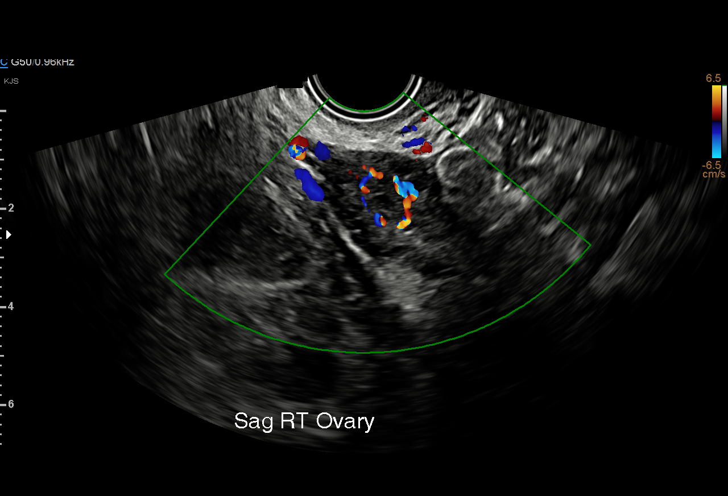
[im 80/94]
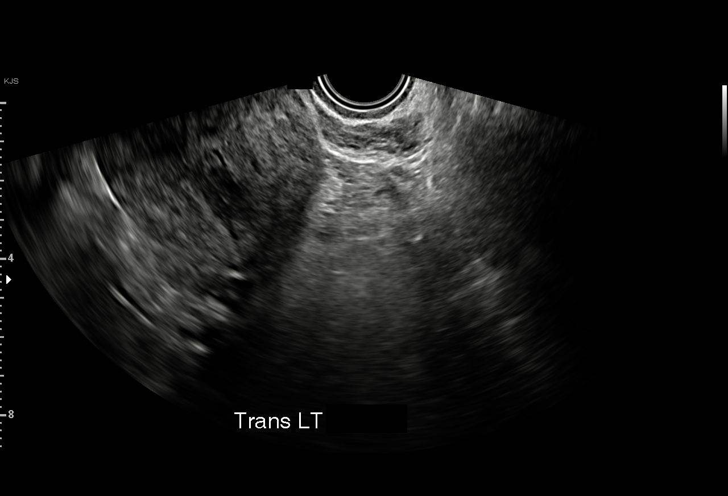
[im 87/94]
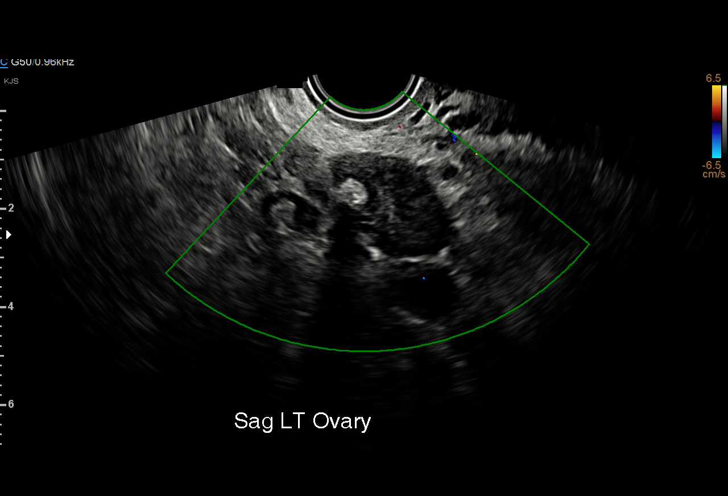
[im 94/94]
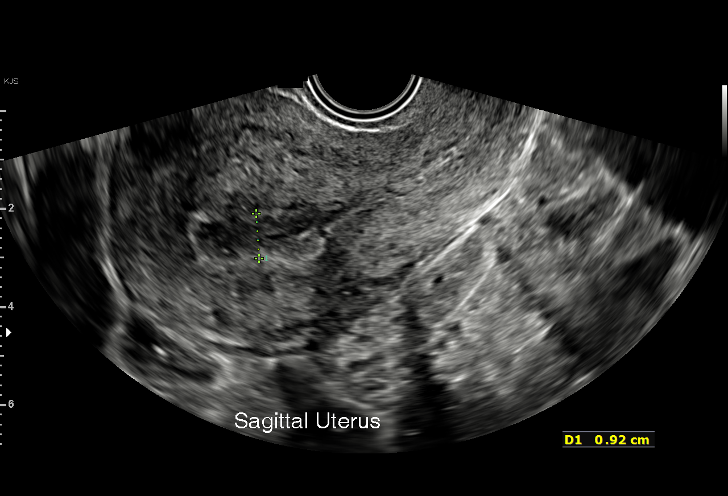

[15 of 28 positions shown; findings below may reference images not displayed]

FINDINGS: Intrauterine gestational sac: None

Yolk sac:  Not Visualized.

Embryo:  Not Visualized.

Cardiac Activity: Not Visualized.

Maternal uterus/adnexae: Anteverted uterus. Endometrial thickness of
0.9 cm. Subserosal anterior uterine body fibroid measuring up to
cm. Right ovary measures 3.9 x 3.2 x 2.8 cm. Right ovarian corpus
luteal cyst. Left ovary measures 3.2 x 2.0 x 2.4 cm. No adnexal
masses identified. No free fluid within the pelvis.
IMPRESSION: No evidence of an intrauterine pregnancy at this time. No adnexal
masses identified. In the setting of a positive pregnancy test, an
ectopic pregnancy cannot be entirely excluded. Recommend close
clinical follow-up, short interval repeat exam, and serial beta HCG
levels.

## 2022-09-23 LAB — OB RESULTS CONSOLE HEPATITIS B SURFACE ANTIGEN: Hepatitis B Surface Ag: NEGATIVE

## 2022-10-03 LAB — OB RESULTS CONSOLE RUBELLA ANTIBODY, IGM: Rubella: IMMUNE

## 2022-10-04 LAB — OB RESULTS CONSOLE GC/CHLAMYDIA
Chlamydia: NEGATIVE
Neisseria Gonorrhea: NEGATIVE

## 2022-12-04 NOTE — L&D Delivery Note (Signed)
Delivery Note Arrived to room just after delivery of infant with Cam Hai, CNM. Vigorous infant on maternal abdomen. Delayed cord clamping was performed. Cord clamped and cut by mother of infant. Venous cord blood was collected. Placenta delivered spontaneously. Cervix, vagina, perineum and labia were inspected and a first degree laceration was noted and repaired using 3-0 Vicryl after administration of 10cc of local anesthetic. Uterus fundus firm. TXA 1g IV x 1 dose was given as prophylaxis. Placenta was inspected, found to be intact with a 3 vessel cord. Counts were correct.   At 10:19 PM a viable and healthy female was delivered via Vaginal, Spontaneous (Presentation: Left Occiput Anterior).  APGAR: 9, 9; weight: pending Placenta status: Spontaneous, Intact.  Cord: 3 vessels with the following complications: None.  Cord pH: Not collected.  Anesthesia: None Episiotomy: None Lacerations: 1st degree Suture Repair: 3.0 vicryl Est. Blood Loss (mL): 153  Mom to postpartum.  Baby to Couplet care / Skin to Skin.  Steva Ready 04/11/2023, 10:39 PM

## 2023-03-10 ENCOUNTER — Inpatient Hospital Stay (HOSPITAL_COMMUNITY)
Admission: AD | Admit: 2023-03-10 | Discharge: 2023-03-10 | Disposition: A | Discharge status: Home / Self Care | Payer: BC Managed Care – PPO | Attending: Obstetrics and Gynecology | Admitting: Obstetrics and Gynecology

## 2023-03-10 ENCOUNTER — Encounter (HOSPITAL_COMMUNITY): Payer: Self-pay | Admitting: *Deleted

## 2023-03-10 DIAGNOSIS — O368130 Decreased fetal movements, third trimester, not applicable or unspecified: Secondary | ICD-10-CM | POA: Insufficient documentation

## 2023-03-10 DIAGNOSIS — Z711 Person with feared health complaint in whom no diagnosis is made: Secondary | ICD-10-CM

## 2023-03-10 DIAGNOSIS — Z3493 Encounter for supervision of normal pregnancy, unspecified, third trimester: Secondary | ICD-10-CM

## 2023-03-10 DIAGNOSIS — Z3A34 34 weeks gestation of pregnancy: Secondary | ICD-10-CM | POA: Insufficient documentation

## 2023-03-10 NOTE — MAU Provider Note (Signed)
History     CSN: [id number]  Arrival date and time: 03/10/23 1412   Event Date/Time   First Provider Initiated Contact with Patient 03/10/23 1458      Chief Complaint  Patient presents with   Decreased Fetal Movement   Ruta Hinds , a  32 y.o. G3P1011 at [redacted]w[redacted]d presents to MAU with complaints of decreased fetal movement. Patient states she started to notice less movement around 9am after breakfast. She states she went about her day and after cleaning noticed decreased fetal movement again. She states she sat down and only got 4-5 fetal kick counts in 1 hour. She states she became concerned and reported to MAU. She denies vaginal bleeding, leaking of fluid, contractions. Since arrival to MAU she states she is feeling movement, just "not often and not as vigorous" as she normally feels it.          OB History     Gravida  3   Para  1   Term  1   Preterm      AB  1   Living  1      SAB  1   IAB      Ectopic      Multiple  0   Live Births  1           Past Medical History:  Diagnosis Date   Medical history non-contributory     Past Surgical History:  Procedure Laterality Date   ORIF WRIST FRACTURE Left 07/30/2018   Procedure: OPEN REDUCTION INTERNAL FIXATION (ORIF) WRIST FRACTURE;  Surgeon: Knute Neu, MD;  Location: [facility] OR;  Service: Plastics;  Laterality: Left;    History reviewed. No pertinent family history.  Social History   Tobacco Use   Smoking status: Never   Smokeless tobacco: Never  Vaping Use   Vaping Use: Never used  Substance Use Topics   Alcohol use: Not Currently   Drug use: Never    Allergies: No Known Allergies  Medications Prior to Admission  Medication Sig Dispense Refill Last Dose   Prenatal Vit-Fe Fumarate-FA (MULTIVITAMIN-PRENATAL) 27-0.8 MG TABS tablet Take 1 tablet by mouth daily at 12 noon.   03/10/2023    Review of Systems  Constitutional:  Negative for chills, fatigue and fever.  Eyes:  Negative for pain  and visual disturbance.  Respiratory:  Negative for apnea, shortness of breath and wheezing.   Cardiovascular:  Negative for chest pain and palpitations.  Gastrointestinal:  Negative for abdominal pain, constipation, diarrhea, nausea and vomiting.  Genitourinary:  Negative for difficulty urinating, dysuria, pelvic pain, vaginal bleeding, vaginal discharge and vaginal pain.  Musculoskeletal:  Negative for back pain.  Neurological:  Negative for seizures, weakness and headaches.  Psychiatric/Behavioral:  Negative for suicidal ideas.    Physical Exam   Blood pressure 123/72, pulse 82, temperature 99.1 F (37.3 C), temperature source Oral, resp. rate 17, height 5\' 4"  (1.626 m), weight 76.7 kg, SpO2 97 %.  Physical Exam Vitals and nursing note reviewed.  Constitutional:      General: She is not in acute distress.    Appearance: Normal appearance.  HENT:     Head: Normocephalic.  Pulmonary:     Effort: Pulmonary effort is normal.  Abdominal:     Palpations: Abdomen is soft.     Comments: Vertex via leopold's. Small fetal parts not palpable by Leopolds maneuver.   Musculoskeletal:     Cervical back: Normal range of motion.  Skin:    General:  Skin is warm and dry.  Neurological:     Mental Status: She is alert and oriented to person, place, and time.  Psychiatric:        Mood and Affect: Mood normal.    FHT: 125bpm with moderate variability, accels present no decels noted. (Appropriate for gestational age)  Toco: UI with occasional contractions   MAU Course  Procedures Patient provided fetal kick count clicker.   MDM - Fetal movement palpated on Leopold's.  - Suspicion for less movement given fetal positioning.  - FHT remained appropriate for gestational age during the duration of stay in MAU.  - Patient hit fetal clicker >20 times in 1 hour.  - Patient reassured by findings in MAU.  - Plan for discharge.   Assessment and Plan   1. Movement of fetus present during  pregnancy in third trimester   2. Physically well but worried   3. [redacted] weeks gestation of pregnancy    - Reviewed 3rd trimester expectations for fetal movement. Discussed the sleep wake cycles for babies at this gestation increases.  - Worsening signs and return precautions reviewed. - Fetal kick count form provided for patient . - FHT appropriate for gestation at time of discharge.  - Patient discharged home in stable condition and may return to MAU as needed.   Claudette Head, MSN CNM  03/10/2023, 2:58 PM

## 2023-03-10 NOTE — MAU Note (Signed)
.  Pamela Spence is a 32 y.o. at [redacted]w[redacted]d here in MAU reporting: DFM since this am.  Pt reports that she usually feels her baby move more frequently and has only felt 8-10 movements all day.  Pt denies VB and LOF.    Onset of complaint: this am  Pain score: 0 Vitals:   03/10/23 1439  BP: 123/72  Pulse: 82  Resp: 17  Temp: 99.1 F (37.3 C)  SpO2: 97%     FHT:140  Lab orders placed from triage:    none

## 2023-04-11 ENCOUNTER — Encounter (HOSPITAL_COMMUNITY): Payer: Self-pay | Admitting: Obstetrics and Gynecology

## 2023-04-11 ENCOUNTER — Inpatient Hospital Stay (HOSPITAL_COMMUNITY)
Admission: AD | Admit: 2023-04-11 | Discharge: 2023-04-13 | DRG: 806 | Disposition: A | Discharge status: Home / Self Care | Payer: BC Managed Care – PPO | Attending: Obstetrics and Gynecology | Admitting: Obstetrics and Gynecology

## 2023-04-11 ENCOUNTER — Other Ambulatory Visit: Payer: Self-pay | Admitting: Obstetrics & Gynecology

## 2023-04-11 DIAGNOSIS — D6959 Other secondary thrombocytopenia: Secondary | ICD-10-CM | POA: Diagnosis present

## 2023-04-11 DIAGNOSIS — O9912 Other diseases of the blood and blood-forming organs and certain disorders involving the immune mechanism complicating childbirth: Secondary | ICD-10-CM | POA: Diagnosis present

## 2023-04-11 DIAGNOSIS — Z3A38 38 weeks gestation of pregnancy: Secondary | ICD-10-CM | POA: Diagnosis not present

## 2023-04-11 DIAGNOSIS — O26893 Other specified pregnancy related conditions, third trimester: Secondary | ICD-10-CM | POA: Diagnosis present

## 2023-04-11 LAB — CBC
HCT: 37.7 % (ref 36.0–46.0)
Hemoglobin: 13.7 g/dL (ref 12.0–15.0)
MCH: 32.5 pg (ref 26.0–34.0)
MCHC: 36.3 g/dL — ABNORMAL HIGH (ref 30.0–36.0)
MCV: 89.5 fL (ref 80.0–100.0)
Platelets: 155 10*3/uL (ref 150–400)
RBC: 4.21 MIL/uL (ref 3.87–5.11)
RDW: 11.7 % (ref 11.5–15.5)
WBC: 10.6 10*3/uL — ABNORMAL HIGH (ref 4.0–10.5)
nRBC: 0 % (ref 0.0–0.2)

## 2023-04-11 LAB — TYPE AND SCREEN
ABO/RH(D): A POS
Antibody Screen: NEGATIVE

## 2023-04-11 LAB — HIV ANTIBODY (ROUTINE TESTING W REFLEX): HIV Screen 4th Generation wRfx: NONREACTIVE

## 2023-04-11 MED ORDER — DIPHENOXYLATE-ATROPINE 2.5-0.025 MG PO TABS
1.0000 | ORAL_TABLET | ORAL | Status: DC | PRN
  Administered 2023-04-11: 1 via ORAL
  Filled 2023-04-11: qty 1

## 2023-04-11 MED ORDER — LACTATED RINGERS IV SOLN: 500.0000 mL | Freq: Once | INTRAVENOUS | Status: DC

## 2023-04-11 MED ORDER — TRANEXAMIC ACID-NACL 1000-0.7 MG/100ML-% IV SOLN
1000.0000 mg | INTRAVENOUS | Status: AC
  Administered 2023-04-11: 1000 mg via INTRAVENOUS
  Filled 2023-04-11: qty 100

## 2023-04-11 MED ORDER — FENTANYL-BUPIVACAINE-NACL 0.5-0.125-0.9 MG/250ML-% EP SOLN
EPIDURAL | Status: AC
  Filled 2023-04-11: qty 250

## 2023-04-11 MED ORDER — TRANEXAMIC ACID-NACL 1000-0.7 MG/100ML-% IV SOLN
INTRAVENOUS | Status: AC
  Filled 2023-04-11: qty 100

## 2023-04-11 MED ORDER — SOD CITRATE-CITRIC ACID 500-334 MG/5ML PO SOLN: 30.0000 mL | ORAL | Status: DC | PRN

## 2023-04-11 MED ORDER — CARBOPROST TROMETHAMINE 250 MCG/ML IM SOLN
250.0000 ug | Freq: Once | INTRAMUSCULAR | Status: AC
  Administered 2023-04-11: 250 ug via INTRAMUSCULAR

## 2023-04-11 MED ORDER — METHYLERGONOVINE MALEATE 0.2 MG/ML IJ SOLN
INTRAMUSCULAR | Status: AC
  Filled 2023-04-11: qty 1

## 2023-04-11 MED ORDER — FENTANYL-BUPIVACAINE-NACL 0.5-0.125-0.9 MG/250ML-% EP SOLN: 12.0000 mL/h | EPIDURAL | Status: DC | PRN

## 2023-04-11 MED ORDER — PHENYLEPHRINE 80 MCG/ML (10ML) SYRINGE FOR IV PUSH (FOR BLOOD PRESSURE SUPPORT): 80.0000 ug | PREFILLED_SYRINGE | INTRAVENOUS | Status: DC | PRN

## 2023-04-11 MED ORDER — ACETAMINOPHEN 325 MG PO TABS: 650.0000 mg | ORAL_TABLET | ORAL | Status: DC | PRN

## 2023-04-11 MED ORDER — METHYLERGONOVINE MALEATE 0.2 MG/ML IJ SOLN
0.2000 mg | Freq: Once | INTRAMUSCULAR | Status: AC
  Administered 2023-04-11: 0.2 mg via INTRAMUSCULAR

## 2023-04-11 MED ORDER — LACTATED RINGERS IV SOLN: 500.0000 mL | INTRAVENOUS | Status: DC | PRN

## 2023-04-11 MED ORDER — EPHEDRINE 5 MG/ML INJ: 10.0000 mg | INTRAVENOUS | Status: DC | PRN

## 2023-04-11 MED ORDER — OXYTOCIN BOLUS FROM INFUSION
333.0000 mL | Freq: Once | INTRAVENOUS | Status: AC
  Administered 2023-04-11: 333 mL via INTRAVENOUS

## 2023-04-11 MED ORDER — FLEET ENEMA 7-19 GM/118ML RE ENEM: 1.0000 | ENEMA | RECTAL | Status: DC | PRN

## 2023-04-11 MED ORDER — LACTATED RINGERS IV SOLN: INTRAVENOUS | Status: DC

## 2023-04-11 MED ORDER — OXYCODONE-ACETAMINOPHEN 5-325 MG PO TABS
2.0000 | ORAL_TABLET | ORAL | Status: DC | PRN
  Administered 2023-04-11: 2 via ORAL
  Filled 2023-04-11: qty 2

## 2023-04-11 MED ORDER — TRANEXAMIC ACID-NACL 1000-0.7 MG/100ML-% IV SOLN
1000.0000 mg | INTRAVENOUS | Status: AC
  Administered 2023-04-11: 1000 mg via INTRAVENOUS

## 2023-04-11 MED ORDER — OXYTOCIN-SODIUM CHLORIDE 30-0.9 UT/500ML-% IV SOLN
2.5000 [IU]/h | INTRAVENOUS | Status: DC
  Filled 2023-04-11: qty 500

## 2023-04-11 MED ORDER — CARBOPROST TROMETHAMINE 250 MCG/ML IM SOLN
INTRAMUSCULAR | Status: AC
  Filled 2023-04-11: qty 1

## 2023-04-11 MED ORDER — OXYCODONE-ACETAMINOPHEN 5-325 MG PO TABS: 1.0000 | ORAL_TABLET | ORAL | Status: DC | PRN

## 2023-04-11 MED ORDER — DIPHENHYDRAMINE HCL 50 MG/ML IJ SOLN: 12.5000 mg | INTRAMUSCULAR | Status: DC | PRN

## 2023-04-11 MED ORDER — ONDANSETRON HCL 4 MG/2ML IJ SOLN: 4.0000 mg | Freq: Four times a day (QID) | INTRAMUSCULAR | Status: DC | PRN

## 2023-04-11 MED ORDER — LIDOCAINE HCL (PF) 1 % IJ SOLN
30.0000 mL | INTRAMUSCULAR | Status: AC | PRN
  Administered 2023-04-11: 30 mL via SUBCUTANEOUS
  Filled 2023-04-11: qty 30

## 2023-04-11 NOTE — Progress Notes (Signed)
Returned to room to evaluate for moderate bleeding with fundal rubs. Order given previously for primary RN to administer Methergine 0.20mg  IM x 1. After re-evaluation 5 minutes after administration, moderate bleeding was still noted.  Upon arrival to room, patient sitting up and nursing. On exam, repaired first degree laceration is hemostatic. No other vaginal/vulvar/perineal/cervical lacerations are noted. Moderate bleeding noted on fundal rub with no clots. Bimanual exam performed with removal of medium-sized blood clot. Fundus firm. The uterine tone is firm and I do not suspect the Jada device would enter into the uterine cavity. After cleansing the urethra with betadine, the bladder was drained of 100cc of clear yellow urine with an in-and-out catheter. Hemabate IM x 1 was administered. Repeat dose of TXA 1g IV x 1 dose was given as well. Uterine fundus firm with minimal lochia. Additional blood loss estimated to be 250 which would make the total EBL 353cc since delivery. Unable to weigh her pad as there is an ice pack lining her pad.  Steva Ready, DO

## 2023-04-11 NOTE — MAU Note (Signed)
.  Pamela Spence is a 32 y.o. at [redacted]w[redacted]d here in MAU reporting:   Contractions every: 2-3  Minutes since 1700 Onset of ctx: Today Pain score: 10/10  ROM: Intact Vaginal Bleeding: None Last SVE: closed  Epidural: Planning  Fetal Movement: Reports positive FM FHT:126 via External  There were no vitals filed for this visit.     OB Office: [facility] GBS: Negative HSV: Denies hx of HSV Lab orders placed from triage: MAU Labor Eval

## 2023-04-11 NOTE — H&P (Signed)
HPI: 32 y.o. G3P1011 @ [redacted]w[redacted]d estimated gestational age (as dated by LMP c/w 12 week ultrasound) presents for contractions. She was found to be 5.5/80/-2 in MAU.  Leakage of fluid:  No Vaginal bleeding:  No Contractions:  Yes Fetal movement:  Yes  Prenatal care has been provided by [facility].  ROS:  Denies fevers, chills, chest pain, visual changes, SOB, RUQ/epigastric pain, N/V, dysuria, hematuria, or sudden onset/worsening bilateral LE or facial edema.  Pregnancy complicated by: Gestational thrombocytopenia (Platelets 129 at 37 weeks) ADHD (no medications) Abnormal Glucola with normal 3h GTT  Prenatal Transfer Tool  Maternal Diabetes: No Genetic Screening: Normal Low risk female Maternal Ultrasounds/Referrals: Normal Fetal Ultrasounds or other Referrals:  Other: Normal anatomy scan Maternal Substance Abuse:  No Significant Maternal Medications:  None Significant Maternal Lab Results: Group B Strep negative   Prenatal Labs Blood type:  A Positive Antibody screen:  Negative CBC:  H/H 14/40 Rubella: Immune RPR:  Non-reactive Hep B:  Negative Hep C:  Negative HIV:  Negative GC/CT:  Negative Glucola:  143, 3h GTT passed   OBHx:  OB History     Gravida  3   Para  1   Term  1   Preterm      AB  1   Living  1      SAB  1   IAB      Ectopic      Multiple  0   Live Births  1          PMHx:  See above Meds:  PNV Allergy:  No Known Allergies SurgHx:  Past Surgical History:  Procedure Laterality Date   ORIF WRIST FRACTURE Left 07/30/2018   Procedure: OPEN REDUCTION INTERNAL FIXATION (ORIF) WRIST FRACTURE;  Surgeon: Knute Neu, MD;  Location: [facility] OR;  Service: Plastics;  Laterality: Left;   SocHx:   Denies Tobacco, ETOH, illicit drugs  O: BP (!) 109/90 (BP Location: Left Arm)   Pulse 69   Temp 98.1 F (36.7 C) (Oral)   Resp (!) 22   Ht 5\' 4"  (1.626 m)   Wt 80.3 kg   BMI 30.38 kg/m  Gen. AAOx3, NAD CV.  Regular rate Resp.  Normal work of breathing Abd. Gravid, soft, non-tender throughout, no rebound/guarding Extr.  No bilateral LE edema, no calf tenderness bilaterally  FHT: 125 baseline, moderate variability, - accels,  - decels Toco: q1-2 min   Labs: see orders  A/P:  32 y.o. G3P1011 @ [redacted]w[redacted]d who is admitted for normal labor.  - Patient delivered upon my arrival - Routine postpartum care - Transfer to Mother Baby Floor  Steva Ready, DO

## 2023-04-12 ENCOUNTER — Other Ambulatory Visit: Payer: Self-pay

## 2023-04-12 ENCOUNTER — Encounter (HOSPITAL_COMMUNITY): Payer: Self-pay | Admitting: Obstetrics and Gynecology

## 2023-04-12 LAB — CBC
HCT: 33.4 % — ABNORMAL LOW (ref 36.0–46.0)
Hemoglobin: 11.9 g/dL — ABNORMAL LOW (ref 12.0–15.0)
MCH: 32.2 pg (ref 26.0–34.0)
MCHC: 35.6 g/dL (ref 30.0–36.0)
MCV: 90.3 fL (ref 80.0–100.0)
Platelets: 140 10*3/uL — ABNORMAL LOW (ref 150–400)
RBC: 3.7 MIL/uL — ABNORMAL LOW (ref 3.87–5.11)
RDW: 11.6 % (ref 11.5–15.5)
WBC: 11 10*3/uL — ABNORMAL HIGH (ref 4.0–10.5)
nRBC: 0 % (ref 0.0–0.2)

## 2023-04-12 LAB — RPR: RPR Ser Ql: NONREACTIVE

## 2023-04-12 MED ORDER — OXYCODONE HCL 5 MG PO TABS: 10.0000 mg | ORAL_TABLET | Freq: Four times a day (QID) | ORAL | Status: DC | PRN

## 2023-04-12 MED ORDER — METHYLERGONOVINE MALEATE 0.2 MG/ML IJ SOLN: 0.2000 mg | INTRAMUSCULAR | Status: DC | PRN

## 2023-04-12 MED ORDER — OXYCODONE HCL 5 MG PO TABS: 5.0000 mg | ORAL_TABLET | Freq: Four times a day (QID) | ORAL | Status: DC | PRN

## 2023-04-12 MED ORDER — PRENATAL MULTIVITAMIN CH
1.0000 | ORAL_TABLET | Freq: Every day | ORAL | Status: DC
  Administered 2023-04-12: 1 via ORAL
  Filled 2023-04-12: qty 1

## 2023-04-12 MED ORDER — DIBUCAINE (PERIANAL) 1 % EX OINT: 1.0000 | TOPICAL_OINTMENT | CUTANEOUS | Status: DC | PRN

## 2023-04-12 MED ORDER — METHYLERGONOVINE MALEATE 0.2 MG PO TABS: 0.2000 mg | ORAL_TABLET | ORAL | Status: DC | PRN

## 2023-04-12 MED ORDER — ONDANSETRON HCL 4 MG/2ML IJ SOLN: 4.0000 mg | INTRAMUSCULAR | Status: DC | PRN

## 2023-04-12 MED ORDER — ZOLPIDEM TARTRATE 5 MG PO TABS: 5.0000 mg | ORAL_TABLET | Freq: Every evening | ORAL | Status: DC | PRN

## 2023-04-12 MED ORDER — SENNOSIDES-DOCUSATE SODIUM 8.6-50 MG PO TABS
2.0000 | ORAL_TABLET | Freq: Every day | ORAL | Status: DC
  Administered 2023-04-12 – 2023-04-13 (×2): 2 via ORAL
  Filled 2023-04-12 (×2): qty 2

## 2023-04-12 MED ORDER — DIPHENHYDRAMINE HCL 25 MG PO CAPS: 25.0000 mg | ORAL_CAPSULE | Freq: Four times a day (QID) | ORAL | Status: DC | PRN

## 2023-04-12 MED ORDER — BENZOCAINE-MENTHOL 20-0.5 % EX AERO: 1.0000 | INHALATION_SPRAY | CUTANEOUS | Status: DC | PRN

## 2023-04-12 MED ORDER — WITCH HAZEL-GLYCERIN EX PADS: 1.0000 | MEDICATED_PAD | CUTANEOUS | Status: DC | PRN

## 2023-04-12 MED ORDER — ONDANSETRON HCL 4 MG PO TABS: 4.0000 mg | ORAL_TABLET | ORAL | Status: DC | PRN

## 2023-04-12 MED ORDER — SIMETHICONE 80 MG PO CHEW: 80.0000 mg | CHEWABLE_TABLET | ORAL | Status: DC | PRN

## 2023-04-12 MED ORDER — ACETAMINOPHEN 325 MG PO TABS: 650.0000 mg | ORAL_TABLET | ORAL | Status: DC | PRN

## 2023-04-12 MED ORDER — COCONUT OIL OIL
1.0000 | TOPICAL_OIL | Status: DC | PRN
  Administered 2023-04-12: 1 via TOPICAL

## 2023-04-12 MED ORDER — IBUPROFEN 600 MG PO TABS
600.0000 mg | ORAL_TABLET | Freq: Four times a day (QID) | ORAL | Status: DC
  Administered 2023-04-12 – 2023-04-13 (×5): 600 mg via ORAL
  Filled 2023-04-12 (×5): qty 1

## 2023-04-12 NOTE — Plan of Care (Signed)

## 2023-04-12 NOTE — Lactation Note (Signed)
This note was copied from a baby's chart. Lactation Consultation Note  Patient Name: Pamela Spence Today's Date: 04/12/2023 Age:32 hours  Reason for consult: Initial assessment;Early term 37-38.6wks  P2, GA [redacted]w[redacted]d, 1% weight loss  Mother in good spirits,. States baby is breastfeeding well and frequently. Mother requested coconut for nipple soreness. Mother has fair skin, denies cracks or skin breakdown. Instructed to call for breast assist as neede or her her nipple become more sore.   Mother reports she pumped for 4 months with her last child (now 24 40/33 years old) and had a good milk supply. Her goal with this baby is breast feed.  Mom made aware of O/P services, breastfeeding support groups, community resources, and our phone # for post-discharge questions.     Maternal Data Has patient been taught Hand Expression?: Yes Does the patient have breastfeeding experience prior to this delivery?: Yes How long did the patient breastfeed?: pumped for 4 months after attempting to breastfeed for 3 days  Feeding Mother's Current Feeding Choice: Breast Milk   Interventions Interventions: Education;LC Services brochure;Coconut oil  Discharge Pump: DEBP;Personal  Consult Status Consult Status: Follow-up Date: 04/13/23 Follow-up type: In-patient    Christella Hartigan M 04/12/2023, 6:33 PM

## 2023-04-12 NOTE — Progress Notes (Addendum)
Post Partum Day 1  Subjective: no complaints, up ad lib, voiding, and tolerating PO  Objective: Blood pressure 123/81, pulse 79, temperature 98.3 F (36.8 C), temperature source Oral, resp. rate 16, height 5\' 4"  (1.626 m), weight 80.3 kg, SpO2 99 %, unknown if currently breastfeeding.  Physical Exam:  General: alert, cooperative, and no distress Lochia: appropriate Uterine Fundus: firm Incision: n/a DVT Evaluation: No evidence of DVT seen on physical exam, no edema.  Recent Labs    04/11/23 2141 04/12/23 0558  HGB 13.7 11.9*  HCT 37.7 33.4*    Assessment/Plan: Plan for discharge tomorrow, Breastfeeding, and Contraception plans for timed intercourse   LOS: 1 day   Jackie Plum, MD 04/12/2023, 8:48 AM

## 2023-04-13 ENCOUNTER — Encounter (HOSPITAL_COMMUNITY): Payer: Self-pay | Admitting: Obstetrics and Gynecology

## 2023-04-13 MED ORDER — ACETAMINOPHEN 325 MG PO TABS: 650.0000 mg | ORAL_TABLET | ORAL | Status: AC | PRN

## 2023-04-13 MED ORDER — IBUPROFEN 600 MG PO TABS: 600.0000 mg | ORAL_TABLET | Freq: Four times a day (QID) | ORAL | 0 refills | Status: AC

## 2023-04-13 NOTE — Discharge Summary (Signed)
Postpartum Discharge Summary  Date of Service updated 04/13/23     Patient Name: Pamela Spence DOB: [birth year] MRN: [id number]  Date of admission: 04/11/2023 Delivery date:04/11/2023  Delivering provider: Steva Ready  Date of discharge: 04/13/2023  Admitting diagnosis: Normal labor [O80, Z37.9] Intrauterine pregnancy: [redacted]w[redacted]d     Secondary diagnosis:  Principal Problem:   Normal labor  Additional problems: Gestational thrombocytopenia (Platelets 129 at 37 weeks) ADHD (no medications) Abnormal Glucola with normal 3h GTT    Discharge diagnosis: Term Pregnancy Delivered and SVD                                               Post partum procedures: none Augmentation: N/A Complications: None  Hospital course: Onset of Labor With Vaginal Delivery      32 y.o. yo [id number] at [redacted]w[redacted]d was admitted in Active Labor on 04/11/2023. Pregnancy complicated by Gestational thrombocytopenia (Platelets 129 at 37 weeks) ADHD (no medications) Abnormal Glucola with normal 3h GTT Labor course was complicated by precipitous labor and birth  Membrane Rupture Time/Date: 10:18 PM ,04/11/2023   Delivery Method:Vaginal, Spontaneous  Episiotomy: None  Lacerations:  1st degree  Patient had a postpartum course complicated by none.  She is ambulating, tolerating a regular diet, passing flatus, and urinating well. Patient is discharged home in stable condition on 04/13/23.  Newborn Data: Birth date:04/11/2023  Birth time:10:19 PM  Gender:Female  Living status:Living  Apgars:9 ,9  Weight:3610 g   Magnesium Sulfate received: No BMZ received: No Rhophylac:N/A MMR:N/A Transfusion:No  Physical exam  Vitals:   04/12/23 1020 04/12/23 1420 04/12/23 1938 04/13/23 0543  BP: 109/76 114/71 116/69 110/83  Pulse: 73 75 75 77  Resp: 18 18 16 16   Temp: 98.4 F (36.9 C) 98.7 F (37.1 C) 98.7 F (37.1 C) 98.1 F (36.7 C)  TempSrc: Oral Oral Oral Oral  SpO2: 100% 100% 100% 97%  Weight:      Height:        General: alert, cooperative, and no distress Lochia: appropriate Uterine Fundus: firm Incision: N/A DVT Evaluation: No evidence of DVT seen on physical exam. No cords or calf tenderness. No significant calf/ankle edema. Labs: Lab Results  Component Value Date   WBC 11.0 (H) 04/12/2023   HGB 11.9 (L) 04/12/2023   HCT 33.4 (L) 04/12/2023   MCV 90.3 04/12/2023   PLT 140 (L) 04/12/2023       No data to display         Edinburgh Score:    04/12/2023   10:20 AM  Edinburgh Postnatal Depression Scale Screening Tool  I have been able to laugh and see the funny side of things. 0  I have looked forward with enjoyment to things. 0  I have blamed myself unnecessarily when things went wrong. 1  I have been anxious or worried for no good reason. 1  I have felt scared or panicky for no good reason. 0  Things have been getting on top of me. 0  I have been so unhappy that I have had difficulty sleeping. 0  I have felt sad or miserable. 0  I have been so unhappy that I have been crying. 0  The thought of harming myself has occurred to me. 0  Edinburgh Postnatal Depression Scale Total 2      After visit meds:  Allergies as of 04/13/2023  No Known Allergies      Medication List     TAKE these medications    acetaminophen 325 MG tablet Commonly known as: Tylenol Take 2 tablets (650 mg total) by mouth every 4 (four) hours as needed (for pain scale < 4).   ibuprofen 600 MG tablet Commonly known as: ADVIL Take 1 tablet (600 mg total) by mouth every 6 (six) hours.   multivitamin-prenatal 27-0.8 MG Tabs tablet Take 1 tablet by mouth daily at 12 noon.         Discharge home in stable condition Infant Feeding: Breast Infant Disposition:home with mother Discharge instruction: per After Visit Summary and Postpartum booklet. Activity: Advance as tolerated. Pelvic rest for 6 weeks.  Diet: routine diet Anticipated Birth Control:  natural family planning Postpartum  Appointment:6 weeks Additional Postpartum F/U:  none Future Appointments:No future appointments. Follow up Visit:  Follow-up Information     [facility]. Schedule an appointment as soon as possible for a visit in 6 week(s).   Specialty: Obstetrics and Gynecology Contact information: [address] [place] [address] [phone]                    04/13/2023 Roma Schanz, CNM

## 2023-04-13 NOTE — Lactation Note (Signed)
This note was copied from a baby's chart. Lactation Consultation Note  Patient Name: Pamela Spence [id number] Date: 04/13/2023 Age:32 hours Reason for consult: Follow-up assessment  P2, Ex BF.  Reviewed engorgement care and monitoring voids/stools. Denies questions or concerns.  Suggest calling if help is needed.   Maternal Data Has patient been taught Hand Expression?: Yes Does the patient have breastfeeding experience prior to this delivery?: Yes  Feeding Mother's Current Feeding Choice: Breast Milk Interventions Interventions: Education  Discharge Discharge Education: Engorgement and breast care;Warning signs for feeding baby  Consult Status Consult Status: Complete Date: 04/14/23    Dahlia Byes [facility]   04/13/2023, 10:24 AM

## 2023-04-23 ENCOUNTER — Telehealth (HOSPITAL_COMMUNITY): Payer: Self-pay | Admitting: *Deleted

## 2023-04-23 NOTE — Telephone Encounter (Signed)
Left phone voicemail message.  Duffy Rhody, RN 04-23-2023 at 11:16am

## 2023-04-26 ENCOUNTER — Encounter (HOSPITAL_COMMUNITY): Payer: BC Managed Care – PPO
